# Patient Record
Sex: Female | Born: 1985 | Race: White | Hispanic: No | Marital: Single | Smoking: Current every day smoker
Health system: Southern US, Community
[De-identification: ages and names within clinical notes are randomized; demographics above are authoritative.]

## PROBLEM LIST (undated history)

## (undated) ENCOUNTER — Inpatient Hospital Stay (HOSPITAL_COMMUNITY): Admission: AD | Payer: Medicaid Other

## (undated) DIAGNOSIS — F32A Depression, unspecified: Secondary | ICD-10-CM

## (undated) DIAGNOSIS — F329 Major depressive disorder, single episode, unspecified: Secondary | ICD-10-CM

## (undated) DIAGNOSIS — F909 Attention-deficit hyperactivity disorder, unspecified type: Secondary | ICD-10-CM

## (undated) DIAGNOSIS — R87629 Unspecified abnormal cytological findings in specimens from vagina: Secondary | ICD-10-CM

## (undated) DIAGNOSIS — F419 Anxiety disorder, unspecified: Secondary | ICD-10-CM

## (undated) DIAGNOSIS — F429 Obsessive-compulsive disorder, unspecified: Secondary | ICD-10-CM

## (undated) HISTORY — PX: APPENDECTOMY: SHX54

## (undated) HISTORY — PX: DIAGNOSTIC LAPAROSCOPY: SUR761

## (undated) HISTORY — DX: Unspecified abnormal cytological findings in specimens from vagina: R87.629

---
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
## 2007-09-13 ENCOUNTER — Emergency Department (HOSPITAL_COMMUNITY): Admission: EM | Admit: 2007-09-13 | Discharge: 2007-09-14 | Payer: Self-pay | Admitting: Emergency Medicine

## 2007-12-24 ENCOUNTER — Other Ambulatory Visit: Admission: RE | Admit: 2007-12-24 | Discharge: 2007-12-24 | Payer: Self-pay | Admitting: Obstetrics and Gynecology

## 2008-01-08 ENCOUNTER — Emergency Department (HOSPITAL_COMMUNITY): Admission: EM | Admit: 2008-01-08 | Discharge: 2008-01-08 | Payer: Self-pay | Admitting: Emergency Medicine

## 2008-03-15 ENCOUNTER — Emergency Department (HOSPITAL_COMMUNITY): Admission: EM | Admit: 2008-03-15 | Discharge: 2008-03-15 | Payer: Self-pay | Admitting: Emergency Medicine

## 2008-04-24 ENCOUNTER — Ambulatory Visit (HOSPITAL_COMMUNITY): Admission: RE | Admit: 2008-04-24 | Discharge: 2008-04-24 | Payer: Self-pay | Admitting: Obstetrics and Gynecology

## 2009-03-25 ENCOUNTER — Inpatient Hospital Stay (HOSPITAL_COMMUNITY): Admission: RE | Admit: 2009-03-25 | Discharge: 2009-03-27 | Payer: Self-pay | Admitting: Obstetrics and Gynecology

## 2009-06-13 ENCOUNTER — Emergency Department (HOSPITAL_COMMUNITY): Admission: EM | Admit: 2009-06-13 | Discharge: 2009-06-13 | Payer: Self-pay | Admitting: Emergency Medicine

## 2009-07-04 ENCOUNTER — Emergency Department (HOSPITAL_COMMUNITY): Admission: EM | Admit: 2009-07-04 | Discharge: 2009-07-04 | Payer: Self-pay | Admitting: Emergency Medicine

## 2010-02-01 ENCOUNTER — Emergency Department (HOSPITAL_COMMUNITY): Admission: EM | Admit: 2010-02-01 | Discharge: 2010-02-01 | Payer: Self-pay | Admitting: Emergency Medicine

## 2010-09-09 ENCOUNTER — Encounter (INDEPENDENT_AMBULATORY_CARE_PROVIDER_SITE_OTHER): Payer: Self-pay | Admitting: *Deleted

## 2010-09-13 ENCOUNTER — Encounter (INDEPENDENT_AMBULATORY_CARE_PROVIDER_SITE_OTHER): Payer: Self-pay | Admitting: *Deleted

## 2010-09-13 ENCOUNTER — Encounter: Payer: Self-pay | Admitting: Urgent Care

## 2010-09-13 ENCOUNTER — Encounter: Payer: Self-pay | Admitting: Internal Medicine

## 2010-09-13 ENCOUNTER — Ambulatory Visit (INDEPENDENT_AMBULATORY_CARE_PROVIDER_SITE_OTHER): Payer: Medicaid Other | Admitting: Urgent Care

## 2010-09-13 DIAGNOSIS — R1031 Right lower quadrant pain: Secondary | ICD-10-CM | POA: Insufficient documentation

## 2010-09-13 DIAGNOSIS — R197 Diarrhea, unspecified: Secondary | ICD-10-CM

## 2010-09-14 NOTE — Letter (Signed)
Summary: Scheduled Appointment  [facility]  [address]   [place], [place] [address]   Phone: [phone]  Fax: [phone]    September 09, 2010   Dear: Christina Lowe            DOB: 1985/10/24    I have been instructed to schedule you an appointment in our office.  Your appointment is as follows:   Date:               September 13, 2010   Time:               10:30am     Please be here 15 minutes early.   Provider:          Jaye Beagle    Please contact the office if you need to reschedule this appointment for a more convenient time.   Thank you,    Diana Eves       [facility] Ph: [phone]   Fax: [phone]

## 2010-09-23 NOTE — Letter (Signed)
Summary: TCS ORDER  TCS ORDER   Imported By: Ave Filter 09/13/2010 11:11:42  _____________________________________________________________________  External Attachment:    Type:   Image     Comment:   External Document

## 2010-09-23 NOTE — Letter (Signed)
Summary: Out of Work Note  [facility]  [address]   [place], [place] [address]   Phone: [phone]  Fax: [phone]    09/13/2010  TO: Leodis Sias IT MAY CONCERN  RE: Christina Lowe [address] [id number] 15-Feb-1986       The above named individual has appointments on 10/05/10 at 10:00 a.m. at [facility] and 10/07/10 at 10:00 a.m. at [facility]..       If you have any further questions or need additional information, please call.     Sincerely,     [facility] R. Roetta Sessions, M.D.    Jonette Eva, M.D. Lorenza Burton, FNP-BC    Tana Coast, PA-C Phone: [phone]    Fax: [phone]

## 2010-09-23 NOTE — Assessment & Plan Note (Signed)
Summary: chronic diarrhea with bleeding   Vital Signs:  Patient profile:   25 year old female Height:      69 inches Weight:      205.50 pounds BMI:     30.46 Temp:     97.9 degrees F oral Pulse rate:   84 / minute BP sitting:   128 / 70  (right arm)  Vitals Entered By: Carolan Clines LPN (September 13, 2010 10:14 AM)  Visit Type:  Initial Consult Referring Provider:  Ninfa Linden Primary Care Provider:  Ninfa Linden, NP  Chief Complaint:  bloody diarrhea.  History of Present Illness: 25 y/o caucasian female w/ RLQ pain x 6 yrs.  Jan 2006 after delivery of child was having RLQ pain.  Had diagnostic lap Dr Emelda Fear 2009-pt states normal.  RLQ pressure intermittant.  Worse w/ eating/drinking.  BM 15 times per day w/ lots of burgundy blood w/clots.  Denies fever, chills, nausea or vomiting.  [facility]  Last unprotected sex-yesterday.  LMP:  1 year ago, was on Depo, but last dose 03/2010.  Denies heartburn, indigestion.    She is steadily gaining weight.   she denies any foreign travel, new medications, antibiotics.  Current Medications (verified): 1)  Klonopin 0.5 Mg Tabs (Clonazepam) .... Take One Once Daily 2)  Neurontin 100 Mg Caps (Gabapentin) .... Take One Once Daily 3)  Zoloft 50 Mg Tabs (Sertraline Hcl) .... Take One Half Tab Once Daily Until Further Instruction Given By Pcm  Allergies (verified): No Known Drug Allergies  Past History:  Past Medical History: cervical colposcopy & bx- ? early ca--Dr Emelda Fear asthma as infant OCD anxiety bipolar disorder  Past Surgical History: laparoscopy x2 csect x2  Family History: No known family history of colorectal carcinoma, IBD, liver or chronic GI problems. 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 9 pkyr Alcohol  Use - yes, rare Illicit Drug Use - no Smoking Status:  current Drug Use:  no  Review of Systems General:  Complains of fatigue, weakness, and malaise; denies fever, chills, sweats, anorexia, weight loss, and sleep disorder. CV:  Denies chest pains, angina, palpitations, syncope, dyspnea on exertion, orthopnea, PND, peripheral edema, and claudication. Resp:  Denies dyspnea at rest, dyspnea with exercise, cough, sputum, wheezing, coughing up blood, and pleurisy. GI:  Denies difficulty swallowing, pain on swallowing, jaundice, and fecal incontinence. GU:  Complains of amenorrhea; denies urinary burning, blood in urine, nocturnal urination, urinary frequency, urinary incontinence, and abnormal vaginal bleeding. MS:  Denies joint pain / LOM, joint swelling, joint stiffness, joint deformity, low back pain, muscle weakness, muscle cramps, muscle atrophy, leg pain at night, leg pain with exertion, and shoulder pain / LOM hand / wrist pain (CTS). Derm:  Denies rash, itching, dry skin, hives, moles, warts, and unhealing ulcers. Psych:  Denies depression, anxiety, memory loss, suicidal ideation, hallucinations, paranoia, phobia, and confusion. Heme:  Denies bruising and enlarged lymph nodes.  Physical Exam  General:  Well developed, well nourished, no acute distress. Head:  Normocephalic and atraumatic. Eyes:  Sclera clear, no icterus. Ears:  Normal auditory acuity. Nose:  No deformity, discharge,  or lesions. Mouth:  No deformity or lesions, dentition normal. Neck:  Supple; no masses or thyromegaly. Lungs:  Clear throughout to auscultation. Heart:  Regular rate and rhythm; no murmurs, rubs,  or  bruits. Abdomen:  Soft, nontender and nondistended. No masses, hepatosplenomegaly or hernias noted. Normal bowel sounds.   no rebound tenderness or guarding. Rectal:  deferred until time of colonoscopy.   Msk:  Symmetrical with no gross deformities. Normal posture. Pulses:  Normal pulses  noted. Extremities:  No clubbing, cyanosis, edema or deformities noted. Neurologic:  Alert and  oriented x4;  grossly normal neurologically. Skin:  Intact without significant lesions or rashes. Cervical Nodes:  No significant cervical adenopathy. Psych:  Alert and cooperative. Normal mood and affect.   Impression & Recommendations:  Problem # 1:  DIARRHEA, BLOODY (ICD-787.91)  Ms. Arciniega is a 25 year old Caucasian female past history of right lower quadrant pain for 6 years. she also gives history of chronic bloody diarrhea. she has had at least 2 laparoscopies and tells me they have been benign. She is going to need further evaluation to rule out inflammatory bowel disease and less likely colorectal carcinoma.  Diagnostic colonoscopy to be performed by Dr. Suszanne Conners Rourk in the near future  in the OR given history of polypharmacy.  I have discussed risks and benefits which include, but are not limited to, bleeding, infection, perforation, or medication reaction.  The patient agrees with this plan and consent will be obtained.  Orders: Consultation Level III [id number])  Problem # 2:  ABDOMINAL PAIN, RIGHT LOWER QUADRANT (ICD-789.03)  See #1  Other Orders: T-Pregnancy Test, Urine, Qual (09811)  Patient Instructions: 1)   pregnancy test prior to colonoscopy  2)   to the ER with severe pain , bleeding , or any worsening signs or symptoms   Orders Added: 1)  T-Pregnancy Test, Urine, Qual [91478] 2)  Consultation Level III [29562]  Appended Document: chronic diarrhea with bleeding  labs reviewed from Ninfa Linden, NP collected on 09/10/2010  White blood cell count 8 Hemoglobin 13.1 Hematocrit 38.5 Platelets 351  CMP normal except glucose 105   TSH normal

## 2010-10-01 LAB — CBC
HCT: 26 % — ABNORMAL LOW (ref 36.0–46.0)
MCHC: 33.9 g/dL (ref 30.0–36.0)
MCHC: 34.4 g/dL (ref 30.0–36.0)
MCV: 93 fL (ref 78.0–100.0)
MCV: 93.5 fL (ref 78.0–100.0)
Platelets: 416 10*3/uL — ABNORMAL HIGH (ref 150–400)
RBC: 3.5 MIL/uL — ABNORMAL LOW (ref 3.87–5.11)
RDW: 13.7 % (ref 11.5–15.5)
WBC: 13.1 10*3/uL — ABNORMAL HIGH (ref 4.0–10.5)

## 2010-10-01 LAB — RPR: RPR Ser Ql: NONREACTIVE

## 2010-10-04 ENCOUNTER — Other Ambulatory Visit (HOSPITAL_COMMUNITY): Payer: Medicaid Other

## 2010-10-05 ENCOUNTER — Encounter (HOSPITAL_COMMUNITY): Payer: Medicaid Other

## 2010-10-05 ENCOUNTER — Other Ambulatory Visit: Payer: Self-pay | Admitting: Internal Medicine

## 2010-10-05 LAB — BASIC METABOLIC PANEL
CO2: 22 mEq/L (ref 19–32)
GFR calc Af Amer: >60 mL/min (ref >60)
GFR calc non Af Amer: >60 mL/min (ref >60)
Glucose, Bld: 94 mg/dL (ref 70–99)
Potassium: 4.3 mEq/L (ref 3.5–5.1)
Sodium: 137 mEq/L (ref 135–145)

## 2010-10-05 LAB — HEMOGLOBIN AND HEMATOCRIT, BLOOD: HCT: 38.4 % (ref 36.0–46.0)

## 2010-10-07 ENCOUNTER — Other Ambulatory Visit: Payer: Self-pay | Admitting: Internal Medicine

## 2010-10-07 ENCOUNTER — Ambulatory Visit (HOSPITAL_COMMUNITY)
Admission: RE | Admit: 2010-10-07 | Discharge: 2010-10-07 | Disposition: A | Discharge status: Home / Self Care | Payer: Medicaid Other | Source: Ambulatory Visit | Attending: Internal Medicine | Admitting: Internal Medicine

## 2010-10-07 ENCOUNTER — Encounter: Payer: Medicaid Other | Admitting: Internal Medicine

## 2010-10-07 DIAGNOSIS — R197 Diarrhea, unspecified: Secondary | ICD-10-CM

## 2010-10-07 DIAGNOSIS — Z79899 Other long term (current) drug therapy: Secondary | ICD-10-CM | POA: Insufficient documentation

## 2010-10-07 DIAGNOSIS — K921 Melena: Secondary | ICD-10-CM | POA: Insufficient documentation

## 2010-10-07 DIAGNOSIS — D129 Benign neoplasm of anus and anal canal: Secondary | ICD-10-CM | POA: Insufficient documentation

## 2010-10-07 DIAGNOSIS — R1031 Right lower quadrant pain: Secondary | ICD-10-CM | POA: Insufficient documentation

## 2010-10-07 DIAGNOSIS — D128 Benign neoplasm of rectum: Secondary | ICD-10-CM | POA: Insufficient documentation

## 2010-10-07 HISTORY — PX: COLONOSCOPY: SHX174

## 2010-10-11 NOTE — Op Note (Signed)
  NAMECORTNEY, Christina Lowe                 ACCOUNT NO.:  [id number]  MEDICAL RECORD NO.:  [id number]           PATIENT TYPE:  O  LOCATION:  DAYP                          FACILITY:  [facility]  PHYSICIAN:  R. Roetta Sessions, M.D. DATE OF BIRTH:  21-Jul-1985  DATE OF PROCEDURE:  10/07/2010 DATE OF DISCHARGE:                              OPERATIVE REPORT   INDICATIONS FOR PROCEDURE:  A 26 year old lady with right lower quadrant abdominal pain for years, intermittent bloody diarrhea reported, H and H normal at 12.7 and 38.4.  Urine pregnancy test negative.  She has been extensively evaluated as the etiology of her symptoms.  Colonoscopy is now being done.  Risks, benefits, limitations, alternatives and imponderables have been discussed because of polypharmacy, she is being done under propofol.  Please see the documentation in the medical record.  PROCEDURE NOTE:  O2 saturation, blood pressure, pulse and respirations were monitored throughout the entire procedure.  Conscious sedation with propofol per [facility].  INSTRUMENT:  Pentax video chip system.  FINDINGS:  Digital rectal exam revealed no abnormalities.  Endoscopic findings:  Prep was good.  Colon:  Colonic mucosa was surveyed from the rectosigmoid junction through the left transverse right colon to the appendiceal orifice, ileocecal valve/cecum.  These structures were well seen and photographed for record.  Terminal ileum was intubated to 15- cm.  From this level, scope was slowly and cautiously withdrawn.  All previously mentioned mucosal surfaces were again seen.  The colonic mucosa appeared entirely normal as did the terminal ileal mucosa.  Scope was pulled down into the rectum where thorough examination of the rectal mucosa including retroflexed view of the anal verge demonstrated a 7-mm pedunculated hyperplastic-appearing polyp in at 3-cm of the anal verge with an adjacent diminutive polyp.  This large polyp was removed  with snare cautery.  The small polyp was ablated.  The remainder of the rectal mucosa appeared entirely normal.  The patient tolerated the procedure well.  Cecal withdrawal time 9 minutes.  IMPRESSION: 1. Small distal rectal polyp, status post snare polypectomy and     ablation, otherwise normal-appearing anal canal, rectum, colon and     terminal ileum. 2. I doubt significant GI bleeding. 3. Chronic right lower quadrant abdominal pain may be due to occult     GYN pathology adhesive disease or chronic appendicitis.  RECOMMENDATIONS: 1. We will follow up on path. 2. May well recommend that she have a surgical consultation for a     diagnostic laparoscopy and incidental appendectomy in the near     future.     Jonathon Bellows, M.D.     RMR/MEDQ  D:  10/07/2010  T:  10/07/2010  Job:  [id number]  Electronically Signed by Lorrin Goodell M.D. on 10/11/2010 12:36:17 PM

## 2010-10-14 ENCOUNTER — Telehealth: Payer: Self-pay

## 2010-10-14 NOTE — Telephone Encounter (Signed)
Pt called requesting results from her tcs. Pt is worried about the results. Please advise

## 2010-10-15 NOTE — Telephone Encounter (Signed)
RMR wrote letter with results for pt. Pt called- I gave her results from letter and told her letter was in the mail. Pt stated she was still having diarrhea and still seeing blood in her stool. Pt wants to know what she should do about it. Please advise.

## 2010-10-17 NOTE — Telephone Encounter (Signed)
We can set up an ov with extender and review everything done to date including my latest recommendations

## 2010-10-19 ENCOUNTER — Encounter: Payer: Self-pay | Admitting: Internal Medicine

## 2010-10-19 NOTE — Telephone Encounter (Signed)
SS- please schedule appt.

## 2010-10-19 NOTE — Telephone Encounter (Signed)
Pt is aware of OV on 10/25/10 @ 2 pm with KJ and I also faxed to [employer] for her to get a gas voucher.

## 2010-10-20 NOTE — H&P (Addendum)
  NAMEDMIYA, MALPHRUS                 ACCOUNT NO.:  [id number]  MEDICAL RECORD NO.:  [id number]           PATIENT TYPE:  LOCATION:  DAYP                          FACILITY:  [facility]  PHYSICIAN:  Dalia Heading, M.D.  DATE OF BIRTH:  1986-03-16  DATE OF ADMISSION: DATE OF DISCHARGE:  LH                             HISTORY & PHYSICAL   CHIEF COMPLAINT:  Chronic right lower quadrant abdominal pain.  HISTORY OF PRESENT ILLNESS:  The patient is a 25 year old white female who has had chronic intermittent right lower quadrant abdominal pain for many years.  This does seem to be associated with bowel movements on occasion.  She has had an extensive workup by Dr. Jena Gauss and Emelda Fear which have not revealed any significant explanation for the right lower quadrant abdominal pain.  It is not made worse with movement.  She denies any fever, chills, nausea, or vomiting.  She states some of this pain started as soon after a C-section in the past, though she does not have pain along the incision site.  PAST MEDICAL HISTORY:  Anxiety/bipolar disorder.  PAST SURGICAL HISTORY:  Diagnostic laparoscopy, C-section and colonoscopy.  CURRENT MEDICATIONS:  Klonopin and Zoloft.  ALLERGIES:  CODEINE.  REVIEW OF SYSTEMS:  The patient smokes half pack cigarettes a day.  She denies alcohol use.  She denies any other cardiopulmonary difficulties or bleeding disorders.  FAMILY MEDICAL HISTORY:  Noncontributory.  PHYSICAL EXAMINATION:  GENERAL:  The patient is a well-developed, well- nourished white female who is slightly anxious, but in no acute distress. HEENT:  Unremarkable. LUNGS:  Clear to auscultation with equal breath sounds bilaterally. HEART:  Regular rate and rhythm without S3, S4 or murmurs. ABDOMEN:  Soft with some tenderness noted on the right lower quadrant to palpation.  No hepatosplenomegaly, masses or hernias identified.  IMPRESSION:  Right lower quadrant abdominal pain of unknown  etiology.  PLAN:  The patient is scheduled for a diagnostic laparoscopy/appendectomy on Oct 29, 2010.  The risks and benefits of the procedure including bleeding, infection, recurrence of the pain, the possibility of an open procedure were fully explained to the patient, gave informed consent.     Dalia Heading, M.D.     MAJ/MEDQ  D:  10/19/2010  T:  10/20/2010  Job:  [id number]  cc:   R. Roetta Sessions, M.D. [address] [place] [address]  Electronically Signed by Franky Macho M.D. on 10/30/2010 08:07:40 PM

## 2010-10-24 ENCOUNTER — Encounter: Payer: Self-pay | Admitting: Internal Medicine

## 2010-10-24 NOTE — Progress Notes (Signed)
Letter should be under my name and not nurses

## 2010-10-25 ENCOUNTER — Ambulatory Visit: Payer: Medicaid Other | Admitting: Urgent Care

## 2010-10-26 ENCOUNTER — Encounter (HOSPITAL_COMMUNITY): Payer: Medicaid Other

## 2010-10-26 ENCOUNTER — Other Ambulatory Visit: Payer: Self-pay | Admitting: General Surgery

## 2010-10-26 LAB — BASIC METABOLIC PANEL
Calcium: 9.6 mg/dL (ref 8.4–10.5)
GFR calc Af Amer: >60 mL/min (ref >60)
GFR calc non Af Amer: >60 mL/min (ref >60)
Glucose, Bld: 80 mg/dL (ref 70–99)
Potassium: 4.1 mEq/L (ref 3.5–5.1)
Sodium: 138 mEq/L (ref 135–145)

## 2010-10-26 LAB — CBC
HCT: 37.3 % (ref 36.0–46.0)
Hemoglobin: 12.6 g/dL (ref 12.0–15.0)
MCHC: 33.8 g/dL (ref 30.0–36.0)
RBC: 4.12 MIL/uL (ref 3.87–5.11)

## 2010-10-26 LAB — SURGICAL PCR SCREEN: MRSA, PCR: NEGATIVE

## 2010-10-29 ENCOUNTER — Ambulatory Visit (HOSPITAL_COMMUNITY)
Admission: RE | Admit: 2010-10-29 | Discharge: 2010-10-29 | Disposition: A | Discharge status: Home / Self Care | Payer: Medicaid Other | Source: Ambulatory Visit | Attending: General Surgery | Admitting: General Surgery

## 2010-10-29 ENCOUNTER — Other Ambulatory Visit: Payer: Self-pay | Admitting: General Surgery

## 2010-10-29 DIAGNOSIS — R1031 Right lower quadrant pain: Secondary | ICD-10-CM | POA: Insufficient documentation

## 2010-10-29 DIAGNOSIS — K3580 Unspecified acute appendicitis: Secondary | ICD-10-CM | POA: Insufficient documentation

## 2010-10-30 NOTE — Op Note (Signed)
Christina Lowe, Christina Lowe                 ACCOUNT NO.:  [id number]  MEDICAL RECORD NO.:  [id number]           PATIENT TYPE:  O  LOCATION:  DAYP                          FACILITY:  [facility]  PHYSICIAN:  Dalia Heading, M.D.  DATE OF BIRTH:  1985/12/03  DATE OF PROCEDURE:  10/29/2010 DATE OF DISCHARGE:                              OPERATIVE REPORT   PREOPERATIVE DIAGNOSIS:  Chronic lower abdominal pain.  POSTOPERATIVE DIAGNOSIS:  Chronic lower abdominal pain.  PROCEDURE:  Diagnostic laparoscopy, laparoscopic appendectomy.  SURGEON:  Dalia Heading, MD  ANESTHESIA:  General endotracheal.  INDICATIONS:  The patient is a 25 year old white female who has had chronic intermittent right lower quadrant abdominal pain for many years. She has had extensive workup by doctors Rourk and [employer].  She now presents to the operating room for diagnostic laparoscopy with incidental appendectomy.  The risks and benefits of the procedure including bleeding, infection, and the possibility of recurrence of the pain were fully explained to the patient, gave informed consent.  PROCEDURE NOTE:  The patient was placed in supine position.  After induction of general endotracheal anesthesia, the abdomen was prepped and draped in the usual sterile technique with DuraPrep.  Surgical site confirmation was performed.  A supraumbilical incision was made down to the fascia.  A Veress needle was introduced into the abdominal cavity and confirmation of placement was done using the saline drop test.  The abdomen was then insufflated to 15 mmHg pressure.  A 11-mm trocar was introduced into the abdominal cavity under direct visualization without difficulty.  The patient was placed in deeper Trendelenburg position.  Additional 12-mm trocar was placed in suprapubic region and a 5-mm trocar was placed in the left lower quadrant region.  The right tube and ovary were noted to be within normal limits.  The terminal ileum was  inspected and there was no evidence of inflammatory bowel disease or Meckel's diverticulum.  The appendix was noted to be significantly elongated and little sclerotic at its base.  It was elected to proceed with an appendectomy.  The mesoappendix was divided using the harmonic scalpel.  A vascular Endo- GIA was placed across the base of the appendix and fired.  The appendix was then removed using EndoCatch bag.  The staple line was inspected and noted to be within normal limits.  All fluid and air was then evacuated from the abdominal cavity prior to removal of the trocars.  All wounds were irrigated with normal saline.  All wounds were injected with 0.5% Sensorcaine.  The supraumbilical fascia was reapproximated using an 0 Vicryl interrupted suture.  All skin incisions were closed using staples.  Betadine ointment and dry sterile dressings were applied.  All tape and needle counts were correct at the end of the procedure. The patient was extubated in the operating room and went back to recovery room awake in stable condition.  COMPLICATIONS:  None. SPECIMEN:  Appendix.  BLOOD LOSS:  Minimal.     Dalia Heading, M.D.     MAJ/MEDQ  D:  10/29/2010  T:  10/29/2010  Job:  [id number]  cc:  Jonathon Bellows, M.D. [address] [place] [address]  Tilda Burrow, M.D. Fax: [phone]  Electronically Signed by Franky Macho M.D. on 10/30/2010 08:07:42 PM
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
## 2010-11-09 NOTE — Op Note (Signed)
NAMEJAYLNN, ULLERY                 ACCOUNT NO.:  [id number]   MEDICAL RECORD NO.:  [id number]          PATIENT TYPE:  AMB   LOCATION:  DAY                           FACILITY:  [facility]   PHYSICIAN:  Tilda Burrow, M.D. DATE OF BIRTH:  18-Aug-1985   DATE OF PROCEDURE:  DATE OF DISCHARGE:                               OPERATIVE REPORT   PREOPERATIVE DIAGNOSES:  1. Chronic right lower quadrant pain.  2. History of right ovarian cystectomy.  3. Possible pelvic adhesions.   POSTOPERATIVE DIAGNOSES:  1. Chronic right lower quadrant pain.  2. Abdominal wall trigger point.  3. Normal pelvic structures without adhesions.   PROCEDURE:  Diagnostic laparoscopy, exploration of trigger point in the  right lower quadrant.   INDICATIONS:  A 25 year old female gravida 1, para 1 with chronic right  lower quadrant pain at the level of the fascia and with a complicated  medical and social history raising questions of pelvic adhesions.  See  HPI.   DETAILS OF PROCEDURE:  The patient was taken to the operating room,  prepped and draped for combined abdominal and vaginal procedure with  single-tooth tenaculum attached to the cervix, long enough to place the  Hulka uterine manipulator in place, and placed Foley catheter.  Abdomen  was addressed by palpating the umbilicus and noticing that sacral  promontory was extremely prominent and at least 4 cm below the  umbilicus.  Effort was made to take the abdominal wall and pull it  inferior to allow introduction of Veress needle.  Pneumoperitoneum was  initiated, but we were in the preperitoneal space as indicated by  pressures.  An open laparoscopy technique was then selected.  The  umbilical midline vertical incision was made at length of 3 cm and then  subcu fatty tissue was opened down to the fascia, which was grasped,  elevated, transected transversely, and then the peritoneal cavity able  to be entered carefully with no evidence of injury to internal  organs.  Laparoscopic 11-mm trocar could then be slipped through the fascia with  good fit and the pneumoperitoneum achieved.  Inspection of the abdomen  and pelvis laparoscopically showed no evidence of trauma to the internal  organs.  The pelvis was inspected and was free of adhesions other than  some scarring and retraction on the anterior abdominal wall at the site  of the old C-section scar, which pulled the urachus and then tied  against the anterior abdominal wall.  Suprapubic trocar was placed as  well as a left lower quadrant trocar.  The adhesions around the urachus  were released to allow for more relaxed pelvic structures.  Photo  documentation was performed to confirm the normalcy of the findings.  A  patient copy will be given.   The abdomen was kept inflated with laparoscopic instruments removed.  Trocar sleeves left in place and the right lower quadrant trigger point  addressed.  The old Pfannenstiel incision was opened at the right corner  and extending approximately 3 cm to pass the end of the incision in a  semilunar cut.  The fatty  tissues were opened down to the fascia, which  could be freed up for some adhesions from the fatty tissue to the  abdominal wall.  The fascia itself in this area was extremely smooth and  well healed, there was absolutely no evidence of herniation in the area.  There was no significant retraction, it was felt that dissection through  the fascia would be counter-productive.  The trigger point was  considered successfully explored.  The fatty tissue and its adhesions  were completely freed up from the underlying fascia.  The fatty tissues  were loosely reapproximated with 3-0 Vicryl.  The deflation of the  abdomen and removal of the laparoscopic instruments was then performed.  The fascia was closed.  The umbilicus was closed using interrupted 0-  Vicryl x3 sutures.  Then, the subcu tissue was closed using 3-0 Vicryl,  and 4-0 Vicryl was used  to close the skin incision with a subcuticular  closure.  Steri-Strips and OpSite were placed, and the patient went to  recovery room in good condition.  Marcaine was injected around each  incision site, total of 10 mL.  Sponge and needle counts were correct.  The patient tolerated the procedure well and went to recovery room in  good condition.      Tilda Burrow, M.D.  Electronically Signed     JVF/MEDQ  D:  04/24/2008  T:  04/25/2008  Job:  [id number]
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
## 2010-11-26 ENCOUNTER — Emergency Department (HOSPITAL_COMMUNITY)
Admission: EM | Admit: 2010-11-26 | Discharge: 2010-11-27 | Disposition: A | Discharge status: Home / Self Care | Payer: Medicaid Other | Attending: Emergency Medicine | Admitting: Emergency Medicine

## 2010-11-26 DIAGNOSIS — B009 Herpesviral infection, unspecified: Secondary | ICD-10-CM | POA: Insufficient documentation

## 2010-11-26 DIAGNOSIS — F17200 Nicotine dependence, unspecified, uncomplicated: Secondary | ICD-10-CM | POA: Insufficient documentation

## 2010-11-29 LAB — HERPES SIMPLEX VIRUS CULTURE: Culture: DETECTED

## 2011-03-21 LAB — URINALYSIS, ROUTINE W REFLEX MICROSCOPIC
Bilirubin Urine: NEGATIVE
Glucose, UA: NEGATIVE
Hgb urine dipstick: NEGATIVE
Ketones, ur: NEGATIVE
Urobilinogen, UA: 0.2

## 2011-03-24 LAB — URINALYSIS, ROUTINE W REFLEX MICROSCOPIC
Glucose, UA: NEGATIVE
Hgb urine dipstick: NEGATIVE
Protein, ur: NEGATIVE
Specific Gravity, Urine: 1.02
pH: 8

## 2011-03-24 LAB — URINE MICROSCOPIC-ADD ON

## 2011-03-24 LAB — CBC
HCT: 37.4
MCV: 89.9
Platelets: 246
RBC: 4.15
RDW: 13.8
WBC: 11.4 — ABNORMAL HIGH
WBC: 11.5 — ABNORMAL HIGH

## 2011-03-24 LAB — CULTURE, BLOOD (ROUTINE X 2): Report Status: 7192009

## 2011-03-24 LAB — DIFFERENTIAL
Basophils Relative: 0
Lymphs Abs: 1.3
Monocytes Relative: 5
Neutro Abs: 9.6 — ABNORMAL HIGH
Neutrophils Relative %: 83 — ABNORMAL HIGH

## 2011-03-24 LAB — WET PREP, GENITAL

## 2011-03-24 LAB — COMPREHENSIVE METABOLIC PANEL
AST: 15
Albumin: 4
BUN: 5 — ABNORMAL LOW
Chloride: 103
Creatinine, Ser: 0.71
GFR calc Af Amer: >60
Potassium: 3.8
Total Protein: 6.8

## 2011-03-24 LAB — GC/CHLAMYDIA PROBE AMP, GENITAL
Chlamydia, DNA Probe: NEGATIVE
GC Probe Amp, Genital: NEGATIVE

## 2011-03-28 LAB — CBC
Hemoglobin: 12.4
MCHC: 33.4
MCV: 90.6
RBC: 4.08
WBC: 7

## 2011-09-17 ENCOUNTER — Emergency Department (HOSPITAL_COMMUNITY): Payer: Medicaid Other

## 2011-09-17 ENCOUNTER — Encounter (HOSPITAL_COMMUNITY): Payer: Self-pay

## 2011-09-17 ENCOUNTER — Emergency Department (HOSPITAL_COMMUNITY)
Admission: EM | Admit: 2011-09-17 | Discharge: 2011-09-17 | Disposition: A | Discharge status: Home / Self Care | Payer: Medicaid Other | Attending: Emergency Medicine | Admitting: Emergency Medicine

## 2011-09-17 DIAGNOSIS — J45909 Unspecified asthma, uncomplicated: Secondary | ICD-10-CM | POA: Insufficient documentation

## 2011-09-17 DIAGNOSIS — M25562 Pain in left knee: Secondary | ICD-10-CM

## 2011-09-17 DIAGNOSIS — W108XXA Fall (on) (from) other stairs and steps, initial encounter: Secondary | ICD-10-CM | POA: Insufficient documentation

## 2011-09-17 DIAGNOSIS — F17200 Nicotine dependence, unspecified, uncomplicated: Secondary | ICD-10-CM | POA: Insufficient documentation

## 2011-09-17 DIAGNOSIS — M25569 Pain in unspecified knee: Secondary | ICD-10-CM | POA: Insufficient documentation

## 2011-09-17 DIAGNOSIS — M7989 Other specified soft tissue disorders: Secondary | ICD-10-CM | POA: Insufficient documentation

## 2011-09-17 MED ORDER — ONDANSETRON 8 MG PO TBDP
8.0000 mg | ORAL_TABLET | Freq: Once | ORAL | Status: AC
  Administered 2011-09-17: 8 mg via ORAL
  Filled 2011-09-17: qty 1

## 2011-09-17 MED ORDER — HYDROCODONE-ACETAMINOPHEN 5-500 MG PO TABS: 1.0000 | ORAL_TABLET | ORAL | Status: AC | PRN

## 2011-09-17 MED ORDER — HYDROCODONE-ACETAMINOPHEN 5-325 MG PO TABS
1.0000 | ORAL_TABLET | Freq: Once | ORAL | Status: AC
  Administered 2011-09-17: 1 via ORAL
  Filled 2011-09-17: qty 1

## 2011-09-17 MED ORDER — IBUPROFEN 800 MG PO TABS: 800.0000 mg | ORAL_TABLET | Freq: Three times a day (TID) | ORAL | Status: AC

## 2011-09-17 NOTE — ED Notes (Signed)
Pt states hurt left knee after falling down stairs yesterday. Pt has noted swelling and bruising to left knee. Ice pack applied and extremity elevated. Xray pending.

## 2011-09-17 NOTE — Discharge Instructions (Signed)
Knee Pain The knee is the complex joint between your thigh and your lower leg. It is made up of bones, tendons, ligaments, and cartilage. The bones that make up the knee are:  The femur in the thigh.   The tibia and fibula in the lower leg.   The patella or kneecap riding in the groove on the lower femur.  CAUSES  Knee pain is a common complaint with many causes. A few of these causes are:  Injury, such as:   A ruptured ligament or tendon injury.   Torn cartilage.   Medical conditions, such as:   Gout   Arthritis   Infections   Overuse, over training or overdoing a physical activity.  Knee pain can be minor or severe. Knee pain can accompany debilitating injury. Minor knee problems often respond well to self-care measures or get well on their own. More serious injuries may need medical intervention or even surgery. SYMPTOMS The knee is complex. Symptoms of knee problems can vary widely. Some of the problems are:  Pain with movement and weight bearing.   Swelling and tenderness.   Buckling of the knee.   Inability to straighten or extend your knee.   Your knee locks and you cannot straighten it.   Warmth and redness with pain and fever.   Deformity or dislocation of the kneecap.  DIAGNOSIS  Determining what is wrong may be very straight forward such as when there is an injury. It can also be challenging because of the complexity of the knee. Tests to make a diagnosis may include:  Your caregiver taking a history and doing a physical exam.   Routine X-rays can be used to rule out other problems. X-rays will not reveal a cartilage tear. Some injuries of the knee can be diagnosed by:   Arthroscopy a surgical technique by which a small video camera is inserted through tiny incisions on the sides of the knee. This procedure is used to examine and repair internal knee joint problems. Tiny instruments can be used during arthroscopy to repair the torn knee cartilage  (meniscus).   Arthrography is a radiology technique. A contrast liquid is directly injected into the knee joint. Internal structures of the knee joint then become visible on X-ray film.   An MRI scan is a non x-ray radiology procedure in which magnetic fields and a computer produce two- or three-dimensional images of the inside of the knee. Cartilage tears are often visible using an MRI scanner. MRI scans have largely replaced arthrography in diagnosing cartilage tears of the knee.   Blood work.   Examination of the fluid that helps to lubricate the knee joint (synovial fluid). This is done by taking a sample out using a needle and a syringe.  TREATMENT The treatment of knee problems depends on the cause. Some of these treatments are:  Depending on the injury, proper casting, splinting, surgery or physical therapy care will be needed.   Give yourself adequate recovery time. Do not overuse your joints. If you begin to get sore during workout routines, back off. Slow down or do fewer repetitions.   For repetitive activities such as cycling or running, maintain your strength and nutrition.   Alternate muscle groups. For example if you are a weight lifter, work the upper body on one day and the lower body the next.   Either tight or weak muscles do not give the proper support for your knee. Tight or weak muscles do not absorb the stress placed   on the knee joint. Keep the muscles surrounding the knee strong.   Take care of mechanical problems.   If you have flat feet, orthotics or special shoes may help. See your caregiver if you need help.   Arch supports, sometimes with wedges on the inner or outer aspect of the heel, can help. These can shift pressure away from the side of the knee most bothered by osteoarthritis.   A brace called an "unloader" brace also may be used to help ease the pressure on the most arthritic side of the knee.   If your caregiver has prescribed crutches, braces,  wraps or ice, use as directed. The acronym for this is PRICE. This means protection, rest, ice, compression and elevation.   Nonsteroidal anti-inflammatory drugs (NSAID's), can help relieve pain. But if taken immediately after an injury, they may actually increase swelling. Take NSAID's with food in your stomach. Stop them if you develop stomach problems. Do not take these if you have a history of ulcers, stomach pain or bleeding from the bowel. Do not take without your caregiver's approval if you have problems with fluid retention, heart failure, or kidney problems.   For ongoing knee problems, physical therapy may be helpful.   Glucosamine and chondroitin are over-the-counter dietary supplements. Both may help relieve the pain of osteoarthritis in the knee. These medicines are different from the usual anti-inflammatory drugs. Glucosamine may decrease the rate of cartilage destruction.   Injections of a corticosteroid drug into your knee joint may help reduce the symptoms of an arthritis flare-up. They may provide pain relief that lasts a few months. You may have to wait a few months between injections. The injections do have a small increased risk of infection, water retention and elevated blood sugar levels.   Hyaluronic acid injected into damaged joints may ease pain and provide lubrication. These injections may work by reducing inflammation. A series of shots may give relief for as long as 6 months.   Topical painkillers. Applying certain ointments to your skin may help relieve the pain and stiffness of osteoarthritis. Ask your pharmacist for suggestions. Many over the-counter products are approved for temporary relief of arthritis pain.   In some countries, doctors often prescribe topical NSAID's for relief of chronic conditions such as arthritis and tendinitis. A review of treatment with NSAID creams found that they worked as well as oral medications but without the serious side effects.    PREVENTION  Maintain a healthy weight. Extra pounds put more strain on your joints.   Get strong, stay limber. Weak muscles are a common cause of knee injuries. Stretching is important. Include flexibility exercises in your workouts.   Be smart about exercise. If you have osteoarthritis, chronic knee pain or recurring injuries, you may need to change the way you exercise. This does not mean you have to stop being active. If your knees ache after jogging or playing basketball, consider switching to swimming, water aerobics or other low-impact activities, at least for a few days a week. Sometimes limiting high-impact activities will provide relief.   Make sure your shoes fit well. Choose footwear that is right for your sport.   Protect your knees. Use the proper gear for knee-sensitive activities. Use kneepads when playing volleyball or laying carpet. Buckle your seat belt every time you drive. Most shattered kneecaps occur in car accidents.   Rest when you are tired.  SEEK MEDICAL CARE IF:  You have knee pain that is continual and does not   seem to be getting better.  SEEK IMMEDIATE MEDICAL CARE IF:  Your knee joint feels hot to the touch and you have a high fever. MAKE SURE YOU:   Understand these instructions.   Will watch your condition.   Will get help right away if you are not doing well or get worse.  Document Released: 04/10/2007 Document Revised: 06/02/2011 Document Reviewed: 04/10/2007 [employer] Patient Information 2012 [employer]. 

## 2011-09-17 NOTE — ED Provider Notes (Signed)
History     CSN: [id number]  Arrival date & time 09/17/11  1352   First MD Initiated Contact with Patient 09/17/11 1401      Chief Complaint  Patient presents with  . Knee Pain    (Consider location/radiation/quality/duration/timing/severity/associated sxs/prior treatment) HPI Comments: Patient complains of pain to her medial left knee. States pain began yesterday after she fell down some steps. Pain is worse with weightbearing or extension of her knee. She denies other injuries. She also denies pain to her left thigh, ankle and calf.  She states that she applied heat to her knee last evening, she also complains of swelling to the left medial knee.  Patient is a 26 y.o. female presenting with knee pain. The history is provided by the patient. No language interpreter was used.  Knee Pain This is a new problem. The current episode started yesterday. The problem occurs constantly. The problem has been unchanged. Associated symptoms include arthralgias and joint swelling. Pertinent negatives include no fever, neck pain, numbness, vomiting or weakness. The symptoms are aggravated by standing, twisting and walking (Palpation). She has tried nothing for the symptoms. The treatment provided no relief.    Past Medical History  Diagnosis Date  . Asthma     Past Surgical History  Procedure Date  . Cesarean section     No family history on file.  History  Substance Use Topics  . Smoking status: Current Everyday Smoker  . Smokeless tobacco: Not on file  . Alcohol Use: Yes     occ    OB History    Grav Para Term Preterm Abortions TAB SAB Ect Mult Living                  Review of Systems  Constitutional: Negative for fever and appetite change.  HENT: Negative for neck pain.   Gastrointestinal: Negative for vomiting.  Musculoskeletal: Positive for joint swelling and arthralgias. Negative for back pain.  Skin: Negative.  Negative for wound.  Neurological: Negative for weakness  and numbness.  All other systems reviewed and are negative.    Allergies  Codeine  Home Medications  No current outpatient prescriptions on file.  BP 135/81  Pulse 99  Temp(Src) 99.2 F (37.3 C) (Oral)  Resp 18  Ht 5\' 9"  (1.753 m)  Wt 174 lb (78.926 kg)  BMI 25.70 kg/m2  SpO2 99%  LMP 04/19/2011  Physical Exam  Nursing note and vitals reviewed. Constitutional: She is oriented to person, place, and time. She appears well-developed and well-nourished. No distress.  HENT:  Head: Normocephalic and atraumatic.  Neck: Normal range of motion. Neck supple.  Cardiovascular: Normal rate, regular rhythm and normal heart sounds.   No murmur heard. Pulmonary/Chest: Effort normal and breath sounds normal. No respiratory distress. She exhibits no tenderness.  Musculoskeletal: She exhibits tenderness. She exhibits no edema.       Left knee: She exhibits decreased range of motion and ecchymosis. She exhibits no swelling, no effusion, no deformity, no laceration and no erythema. tenderness found. Medial joint line tenderness noted.       Legs:      Diffuse tenderness to palpation of the medial left knee. No obvious effusion or soft tissue swelling. There is a small old appearing bruise just medial to the patella. Left calf is soft and nontender. DP pulse is brisk, distal sensation intact  Neurological: She is alert and oriented to person, place, and time. She exhibits normal muscle tone. Coordination normal.  Skin:  Skin is warm and dry.    ED Course  Procedures (including critical care time)  Labs Reviewed - No data to display Dg Knee Complete 4 Views Left  09/17/2011  *RADIOLOGY REPORT*  Clinical Data: Post fall, now with medial knee pain and swelling.  LEFT KNEE - COMPLETE 4+ VIEW  Comparison: 03/15/2008  Findings: No fracture or dislocation.  No joint effusion.  Joint spaces are preserved.  Regional soft tissues are normal.  IMPRESSION: Normal radiographs of the left knee.  Original  Report Authenticated By: Waynard Reeds, M.D.        MDM    Patient has tenderness to palpation of the medial left knee. No obvious effusion, deformity, patellar tenderness or erythema. There is a small area of bruising. Today's x-rays are within normal limits.  Injury could likely be related to the meniscus or ligamentous injury.   I will place her in a knee immobilizer and dispense crutches she agrees to close followup with and orthopedist.  Patient / Family / Caregiver understand and agree with initial ED impression and plan with expectations set for ED visit. Pt stable in ED with no significant deterioration in condition. Pt feels improved after observation and/or treatment in ED.          Earnestine Tuohey L. Ramirez [place], [place] 09/17/11 1502

## 2011-09-17 NOTE — ED Notes (Signed)
Pt reports fell down 3 stairs yesterday playing with her kids.  C/O pain to left knee.

## 2011-09-18 NOTE — ED Provider Notes (Signed)
Medical screening examination/treatment/procedure(s) were performed by non-physician practitioner and as supervising physician I was immediately available for consultation/collaboration.   Jerlisa Diliberto W Tykeem Lanzer, MD 09/18/11 0700 

## 2011-09-21 ENCOUNTER — Other Ambulatory Visit: Payer: Self-pay | Admitting: Obstetrics and Gynecology

## 2011-09-21 ENCOUNTER — Other Ambulatory Visit (HOSPITAL_COMMUNITY)
Admission: RE | Admit: 2011-09-21 | Discharge: 2011-09-21 | Disposition: A | Discharge status: Home / Self Care | Payer: Medicaid Other | Source: Ambulatory Visit | Attending: Obstetrics and Gynecology | Admitting: Obstetrics and Gynecology

## 2011-09-21 DIAGNOSIS — Z01419 Encounter for gynecological examination (general) (routine) without abnormal findings: Secondary | ICD-10-CM | POA: Insufficient documentation

## 2011-09-21 DIAGNOSIS — Z113 Encounter for screening for infections with a predominantly sexual mode of transmission: Secondary | ICD-10-CM | POA: Insufficient documentation

## 2011-10-11 ENCOUNTER — Other Ambulatory Visit: Payer: Self-pay | Admitting: Obstetrics and Gynecology

## 2011-11-29 LAB — OB RESULTS CONSOLE RUBELLA ANTIBODY, IGM: Rubella: IMMUNE

## 2011-11-29 LAB — OB RESULTS CONSOLE HEPATITIS B SURFACE ANTIGEN: Hepatitis B Surface Ag: NEGATIVE

## 2011-11-29 LAB — OB RESULTS CONSOLE ABO/RH: RH Type: POSITIVE

## 2011-11-29 LAB — OB RESULTS CONSOLE RPR: RPR: NONREACTIVE

## 2012-03-15 ENCOUNTER — Other Ambulatory Visit: Payer: Self-pay | Admitting: Obstetrics & Gynecology

## 2012-06-05 ENCOUNTER — Encounter (HOSPITAL_COMMUNITY): Payer: Self-pay | Admitting: Pharmacist

## 2012-06-08 ENCOUNTER — Encounter (HOSPITAL_COMMUNITY): Payer: Self-pay

## 2012-06-11 ENCOUNTER — Encounter (HOSPITAL_COMMUNITY)
Admission: RE | Admit: 2012-06-11 | Discharge: 2012-06-11 | Disposition: A | Discharge status: Home / Self Care | Payer: Medicaid Other | Source: Ambulatory Visit | Attending: Obstetrics and Gynecology | Admitting: Obstetrics and Gynecology

## 2012-06-11 ENCOUNTER — Encounter (HOSPITAL_COMMUNITY): Payer: Self-pay

## 2012-06-11 HISTORY — DX: Anxiety disorder, unspecified: F41.9

## 2012-06-11 HISTORY — DX: Depression, unspecified: F32.A

## 2012-06-11 HISTORY — DX: Major depressive disorder, single episode, unspecified: F32.9

## 2012-06-11 LAB — SURGICAL PCR SCREEN: MRSA, PCR: NEGATIVE

## 2012-06-11 LAB — CBC
Hemoglobin: 10.7 g/dL — ABNORMAL LOW (ref 12.0–15.0)
MCH: 30.6 pg (ref 26.0–34.0)
MCHC: 32.6 g/dL (ref 30.0–36.0)
MCV: 93.7 fL (ref 78.0–100.0)

## 2012-06-11 LAB — RPR: RPR Ser Ql: NONREACTIVE

## 2012-06-11 LAB — ABO/RH: ABO/RH(D): B POS

## 2012-06-11 LAB — TYPE AND SCREEN

## 2012-06-11 NOTE — Patient Instructions (Addendum)
Your procedure is scheduled on:06/16/12  Enter through the Main Entrance at :0730 am Pick up desk phone and dial [phone] and inform us of your arrival.  Please call [phone] if you have any problems the morning of surgery.  Remember: Do not eat or drink after midnight:Friday   Take these meds the morning of surgery with a sip of water:Klonopin  DO NOT wear jewelry, eye make-up, lipstick,body lotion, or dark fingernail polish. Do not shave for 48 hours prior to surgery.  If you are to be admitted after surgery, leave suitcase in car until your room has been assigned.

## 2012-06-13 ENCOUNTER — Other Ambulatory Visit (HOSPITAL_COMMUNITY): Payer: Self-pay | Admitting: Obstetrics and Gynecology

## 2012-06-15 ENCOUNTER — Encounter (HOSPITAL_COMMUNITY): Payer: Self-pay | Admitting: Obstetrics and Gynecology

## 2012-06-15 DIAGNOSIS — O34219 Maternal care for unspecified type scar from previous cesarean delivery: Secondary | ICD-10-CM | POA: Diagnosis present

## 2012-06-15 NOTE — H&P (Signed)
Christina Lowe is a 26 y.o. female presenting for repeat cesarean section at 39 weeks 1 day after pregnancy course followed at [facility] since may 8, well dated by early first trimester ultrasounds. She does not desire any contraception at the time of cesarean, instead plans Depo-Provera in the future. 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 Initial UDS positive for THC subsequently negative. She has anxiety issues and takes Klonopin 1 mg daily OB History    Grav Para Term Preterm Abortions TAB SAB Ect Mult Living   4 2 1 1 1     2      Past Medical History  Diagnosis Date  . Anxiety     panic disorder  . Depression     has history- ok now  . Asthma     no inhaler   Past Surgical History  Procedure Date  . Cesarean section   . Appendectomy   . Diagnostic laparoscopy      Family History: family history is not on file. Social History:  reports that she has been smoking Cigarettes.  She has been smoking about .5 packs per day. She does not have any smokeless tobacco history on file. She reports that she does not drink alcohol or use illicit drugs.   Prenatal Transfer Tool  Maternal Diabetes: No Genetic Screening: Normal quad screen negative Maternal Ultrasounds/Referrals: Normal Fetal Ultrasounds or other Referrals:  None Maternal Substance Abuse:  Yes:  Type: Marijuana, Other:  used and noted positive in early pregnancy but subsequent negative patient reports she stopped Significant Maternal Medications:  Meds include: Other:  Klonopin 1 mg daily Significant Maternal Lab Results:  GBS positive 05/31/2012 Other Comments:  None  ROS    There were no vitals taken for this visit. Exam Physical Exam weight 210.6 pounds blood pressure 130/80 height 5 feet 9 Physical Examination: General appearance - alert, well appearing, and in no distress, oriented to person, place, and time and anxious Mental status -  alert, oriented to person, place, and time, normal mood, behavior, speech, dress, motor activity, and thought processes, affect appropriate to mood Abdomen - soft, nontender, nondistended, no masses or organomegaly Gravid uterus consistent with dates 37 cm fundal height at last visit Pelvic - CERVIX: normal appearing cervix without discharge or lesions Extremities - peripheral pulses normal, no pedal edema, no clubbing or cyanosis, Homan's sign negative bilaterally   Prenatal labs: ABO, Rh: --/--/B POS (12/16 1115) Antibody: NEG (12/16 1101) Rubella: Immune (06/04 0000) RPR: NON REACTIVE (12/16 1102)  HBsAg: Negative (06/04 0000)  HIV: Non-reactive (06/04 0000)  GBS:   positive  Assessment/Plan: Pregnancy 39 weeks 1 day prior cesarean section x2 for repeat cesarean section 06/16/2012 and 9 AM case #[id number] Mental aspects of procedure been reviewed to patient's satisfaction plans are to nuclear previous cesarean scar risk of infection bleeding are well known to patient   Christina Lowe V 06/15/2012, 11:05 AM

## 2012-06-16 ENCOUNTER — Inpatient Hospital Stay (HOSPITAL_COMMUNITY): Payer: Medicaid Other | Admitting: Anesthesiology

## 2012-06-16 ENCOUNTER — Inpatient Hospital Stay (HOSPITAL_COMMUNITY)
Admission: RE | Admit: 2012-06-16 | Discharge: 2012-06-18 | DRG: 766 | Disposition: A | Discharge status: Home / Self Care | Payer: Medicaid Other | Source: Ambulatory Visit | Attending: Obstetrics and Gynecology | Admitting: Obstetrics and Gynecology

## 2012-06-16 ENCOUNTER — Encounter (HOSPITAL_COMMUNITY): Payer: Self-pay | Admitting: *Deleted

## 2012-06-16 ENCOUNTER — Encounter (HOSPITAL_COMMUNITY)
Admission: RE | Disposition: A | Discharge status: Home / Self Care | Payer: Self-pay | Source: Ambulatory Visit | Attending: Obstetrics and Gynecology

## 2012-06-16 ENCOUNTER — Encounter (HOSPITAL_COMMUNITY): Payer: Self-pay | Admitting: Anesthesiology

## 2012-06-16 DIAGNOSIS — F319 Bipolar disorder, unspecified: Secondary | ICD-10-CM | POA: Diagnosis present

## 2012-06-16 DIAGNOSIS — O99344 Other mental disorders complicating childbirth: Secondary | ICD-10-CM | POA: Diagnosis present

## 2012-06-16 DIAGNOSIS — Z22330 Carrier of Group B streptococcus: Secondary | ICD-10-CM

## 2012-06-16 DIAGNOSIS — Z01818 Encounter for other preprocedural examination: Secondary | ICD-10-CM

## 2012-06-16 DIAGNOSIS — Z98891 History of uterine scar from previous surgery: Secondary | ICD-10-CM

## 2012-06-16 DIAGNOSIS — O99892 Other specified diseases and conditions complicating childbirth: Secondary | ICD-10-CM

## 2012-06-16 DIAGNOSIS — O34219 Maternal care for unspecified type scar from previous cesarean delivery: Secondary | ICD-10-CM

## 2012-06-16 DIAGNOSIS — Z01812 Encounter for preprocedural laboratory examination: Secondary | ICD-10-CM

## 2012-06-16 DIAGNOSIS — O9989 Other specified diseases and conditions complicating pregnancy, childbirth and the puerperium: Secondary | ICD-10-CM

## 2012-06-16 LAB — TYPE AND SCREEN

## 2012-06-16 SURGERY — Surgical Case
Anesthesia: Spinal | Site: Abdomen | Wound class: Clean Contaminated

## 2012-06-16 MED ORDER — IBUPROFEN 600 MG PO TABS
600.0000 mg | ORAL_TABLET | Freq: Four times a day (QID) | ORAL | Status: DC | PRN
  Filled 2012-06-16 (×4): qty 1

## 2012-06-16 MED ORDER — LACTATED RINGERS IV SOLN
INTRAVENOUS | Status: DC
  Administered 2012-06-16: 08:00:00 via INTRAVENOUS

## 2012-06-16 MED ORDER — FENTANYL CITRATE 0.05 MG/ML IJ SOLN
INTRAMUSCULAR | Status: DC | PRN
  Administered 2012-06-16: 85 ug via INTRAVENOUS

## 2012-06-16 MED ORDER — DIPHENHYDRAMINE HCL 25 MG PO CAPS: 25.0000 mg | ORAL_CAPSULE | Freq: Four times a day (QID) | ORAL | Status: DC | PRN

## 2012-06-16 MED ORDER — CEFAZOLIN SODIUM-DEXTROSE 2-3 GM-% IV SOLR: 2.0000 g | INTRAVENOUS | Status: DC

## 2012-06-16 MED ORDER — SCOPOLAMINE 1 MG/3DAYS TD PT72
MEDICATED_PATCH | TRANSDERMAL | Status: AC
  Administered 2012-06-16: 1.5 mg via TRANSDERMAL
  Filled 2012-06-16: qty 1

## 2012-06-16 MED ORDER — OXYTOCIN 10 UNIT/ML IJ SOLN
INTRAMUSCULAR | Status: AC
  Filled 2012-06-16: qty 4

## 2012-06-16 MED ORDER — CEFAZOLIN SODIUM-DEXTROSE 2-3 GM-% IV SOLR
INTRAVENOUS | Status: AC
  Filled 2012-06-16: qty 50

## 2012-06-16 MED ORDER — FENTANYL CITRATE 0.05 MG/ML IJ SOLN
INTRAMUSCULAR | Status: AC
  Filled 2012-06-16: qty 2

## 2012-06-16 MED ORDER — OXYTOCIN 40 UNITS IN LACTATED RINGERS INFUSION - SIMPLE MED: 62.5000 mL/h | INTRAVENOUS | Status: AC

## 2012-06-16 MED ORDER — METOCLOPRAMIDE HCL 5 MG/ML IJ SOLN: 10.0000 mg | Freq: Three times a day (TID) | INTRAMUSCULAR | Status: DC | PRN

## 2012-06-16 MED ORDER — ONDANSETRON HCL 4 MG/2ML IJ SOLN: 4.0000 mg | Freq: Three times a day (TID) | INTRAMUSCULAR | Status: DC | PRN

## 2012-06-16 MED ORDER — FENTANYL CITRATE 0.05 MG/ML IJ SOLN
25.0000 ug | INTRAMUSCULAR | Status: DC | PRN
  Administered 2012-06-16 (×3): 50 ug via INTRAVENOUS

## 2012-06-16 MED ORDER — PHENYLEPHRINE HCL 10 MG/ML IJ SOLN
INTRAMUSCULAR | Status: DC | PRN
  Administered 2012-06-16: 80 ug via INTRAVENOUS
  Administered 2012-06-16: 120 ug via INTRAVENOUS

## 2012-06-16 MED ORDER — ONDANSETRON HCL 4 MG/2ML IJ SOLN: 4.0000 mg | INTRAMUSCULAR | Status: DC | PRN

## 2012-06-16 MED ORDER — TETANUS-DIPHTH-ACELL PERTUSSIS 5-2.5-18.5 LF-MCG/0.5 IM SUSP: 0.5000 mL | Freq: Once | INTRAMUSCULAR | Status: DC

## 2012-06-16 MED ORDER — MORPHINE SULFATE (PF) 0.5 MG/ML IJ SOLN
INTRAMUSCULAR | Status: DC | PRN
  Administered 2012-06-16: .1 mg via INTRATHECAL

## 2012-06-16 MED ORDER — ACETAMINOPHEN 10 MG/ML IV SOLN
1000.0000 mg | Freq: Once | INTRAVENOUS | Status: AC | PRN
  Administered 2012-06-16: 1000 mg via INTRAVENOUS
  Filled 2012-06-16: qty 100

## 2012-06-16 MED ORDER — CEFAZOLIN SODIUM-DEXTROSE 2-3 GM-% IV SOLR
INTRAVENOUS | Status: DC | PRN
  Administered 2012-06-16: 2 g via INTRAVENOUS

## 2012-06-16 MED ORDER — FENTANYL CITRATE 0.05 MG/ML IJ SOLN
INTRAMUSCULAR | Status: AC
  Administered 2012-06-16: 50 ug via INTRAVENOUS
  Filled 2012-06-16: qty 2

## 2012-06-16 MED ORDER — LACTATED RINGERS IV SOLN
INTRAVENOUS | Status: DC
  Administered 2012-06-16: 22:00:00 via INTRAVENOUS
  Administered 2012-06-16: 125 mL/h via INTRAVENOUS

## 2012-06-16 MED ORDER — SIMETHICONE 80 MG PO CHEW: 80.0000 mg | CHEWABLE_TABLET | ORAL | Status: DC | PRN

## 2012-06-16 MED ORDER — OXYCODONE-ACETAMINOPHEN 5-325 MG PO TABS
1.0000 | ORAL_TABLET | ORAL | Status: DC | PRN
  Administered 2012-06-16: 2 via ORAL
  Administered 2012-06-17: 1 via ORAL
  Administered 2012-06-17 (×2): 2 via ORAL
  Administered 2012-06-17: 1 via ORAL
  Administered 2012-06-17 – 2012-06-18 (×3): 2 via ORAL
  Administered 2012-06-18 (×2): 1 via ORAL
  Filled 2012-06-16 (×3): qty 2
  Filled 2012-06-16: qty 1
  Filled 2012-06-16 (×4): qty 2
  Filled 2012-06-16: qty 1
  Filled 2012-06-16: qty 2

## 2012-06-16 MED ORDER — ONDANSETRON HCL 4 MG PO TABS: 4.0000 mg | ORAL_TABLET | ORAL | Status: DC | PRN

## 2012-06-16 MED ORDER — LANOLIN HYDROUS EX OINT: 1.0000 "application " | TOPICAL_OINTMENT | CUTANEOUS | Status: DC | PRN

## 2012-06-16 MED ORDER — SODIUM CHLORIDE 0.9 % IJ SOLN: 3.0000 mL | INTRAMUSCULAR | Status: DC | PRN

## 2012-06-16 MED ORDER — ZOLPIDEM TARTRATE 5 MG PO TABS: 5.0000 mg | ORAL_TABLET | Freq: Every evening | ORAL | Status: DC | PRN

## 2012-06-16 MED ORDER — NALBUPHINE HCL 10 MG/ML IJ SOLN
5.0000 mg | INTRAMUSCULAR | Status: DC | PRN
  Filled 2012-06-16: qty 1

## 2012-06-16 MED ORDER — MORPHINE SULFATE 0.5 MG/ML IJ SOLN
INTRAMUSCULAR | Status: AC
  Filled 2012-06-16: qty 10

## 2012-06-16 MED ORDER — PRENATAL MULTIVITAMIN CH
1.0000 | ORAL_TABLET | Freq: Every day | ORAL | Status: DC
  Administered 2012-06-16 – 2012-06-18 (×3): 1 via ORAL
  Filled 2012-06-16 (×3): qty 1

## 2012-06-16 MED ORDER — SIMETHICONE 80 MG PO CHEW
80.0000 mg | CHEWABLE_TABLET | Freq: Three times a day (TID) | ORAL | Status: DC
  Administered 2012-06-16 – 2012-06-18 (×7): 80 mg via ORAL

## 2012-06-16 MED ORDER — KETOROLAC TROMETHAMINE 60 MG/2ML IM SOLN
INTRAMUSCULAR | Status: AC
  Administered 2012-06-16: 60 mg via INTRAMUSCULAR
  Filled 2012-06-16: qty 2

## 2012-06-16 MED ORDER — PHENYLEPHRINE 40 MCG/ML (10ML) SYRINGE FOR IV PUSH (FOR BLOOD PRESSURE SUPPORT)
PREFILLED_SYRINGE | INTRAVENOUS | Status: AC
  Filled 2012-06-16: qty 10

## 2012-06-16 MED ORDER — SCOPOLAMINE 1 MG/3DAYS TD PT72
1.0000 | MEDICATED_PATCH | Freq: Once | TRANSDERMAL | Status: DC
  Filled 2012-06-16: qty 1

## 2012-06-16 MED ORDER — NALOXONE HCL 0.4 MG/ML IJ SOLN: 0.4000 mg | INTRAMUSCULAR | Status: DC | PRN

## 2012-06-16 MED ORDER — MEPERIDINE HCL 25 MG/ML IJ SOLN: 6.2500 mg | INTRAMUSCULAR | Status: DC | PRN

## 2012-06-16 MED ORDER — DIBUCAINE 1 % RE OINT: 1.0000 "application " | TOPICAL_OINTMENT | RECTAL | Status: DC | PRN

## 2012-06-16 MED ORDER — IBUPROFEN 600 MG PO TABS
600.0000 mg | ORAL_TABLET | Freq: Four times a day (QID) | ORAL | Status: DC
  Administered 2012-06-17 – 2012-06-18 (×6): 600 mg via ORAL
  Filled 2012-06-16: qty 1

## 2012-06-16 MED ORDER — DIPHENHYDRAMINE HCL 25 MG PO CAPS
25.0000 mg | ORAL_CAPSULE | ORAL | Status: DC | PRN
  Administered 2012-06-16: 25 mg via ORAL
  Filled 2012-06-16: qty 1

## 2012-06-16 MED ORDER — ONDANSETRON HCL 4 MG/2ML IJ SOLN
INTRAMUSCULAR | Status: DC | PRN
  Administered 2012-06-16: 4 mg via INTRAVENOUS

## 2012-06-16 MED ORDER — KETOROLAC TROMETHAMINE 30 MG/ML IJ SOLN: 30.0000 mg | Freq: Four times a day (QID) | INTRAMUSCULAR | Status: AC | PRN

## 2012-06-16 MED ORDER — FENTANYL CITRATE 0.05 MG/ML IJ SOLN
INTRAMUSCULAR | Status: DC | PRN
  Administered 2012-06-16: 15 ug via INTRATHECAL

## 2012-06-16 MED ORDER — BUPIVACAINE IN DEXTROSE 0.75-8.25 % IT SOLN
INTRATHECAL | Status: DC | PRN
  Administered 2012-06-16: 1.5 mL via INTRATHECAL

## 2012-06-16 MED ORDER — DIPHENHYDRAMINE HCL 50 MG/ML IJ SOLN: 25.0000 mg | INTRAMUSCULAR | Status: DC | PRN

## 2012-06-16 MED ORDER — KETOROLAC TROMETHAMINE 30 MG/ML IJ SOLN
30.0000 mg | Freq: Four times a day (QID) | INTRAMUSCULAR | Status: AC | PRN
  Administered 2012-06-16: 30 mg via INTRAVENOUS
  Filled 2012-06-16: qty 1

## 2012-06-16 MED ORDER — NALOXONE HCL 1 MG/ML IJ SOLN
1.0000 ug/kg/h | INTRAVENOUS | Status: DC | PRN
  Filled 2012-06-16: qty 2

## 2012-06-16 MED ORDER — SENNOSIDES-DOCUSATE SODIUM 8.6-50 MG PO TABS
2.0000 | ORAL_TABLET | Freq: Every day | ORAL | Status: DC
  Administered 2012-06-16 – 2012-06-17 (×2): 2 via ORAL

## 2012-06-16 MED ORDER — KETOROLAC TROMETHAMINE 60 MG/2ML IM SOLN
60.0000 mg | Freq: Once | INTRAMUSCULAR | Status: AC | PRN
  Administered 2012-06-16: 60 mg via INTRAMUSCULAR

## 2012-06-16 MED ORDER — DIPHENHYDRAMINE HCL 50 MG/ML IJ SOLN: 12.5000 mg | INTRAMUSCULAR | Status: DC | PRN

## 2012-06-16 MED ORDER — CLONAZEPAM 0.5 MG PO TABS
0.5000 mg | ORAL_TABLET | Freq: Two times a day (BID) | ORAL | Status: DC
  Administered 2012-06-16 – 2012-06-18 (×4): 0.5 mg via ORAL
  Filled 2012-06-16 (×5): qty 1

## 2012-06-16 MED ORDER — WITCH HAZEL-GLYCERIN EX PADS: 1.0000 "application " | MEDICATED_PAD | CUTANEOUS | Status: DC | PRN

## 2012-06-16 MED ORDER — SCOPOLAMINE 1 MG/3DAYS TD PT72
1.0000 | MEDICATED_PATCH | Freq: Once | TRANSDERMAL | Status: DC
  Administered 2012-06-16: 1.5 mg via TRANSDERMAL

## 2012-06-16 MED ORDER — OXYTOCIN 10 UNIT/ML IJ SOLN
40.0000 [IU] | INTRAVENOUS | Status: DC | PRN
  Administered 2012-06-16: 40 [IU] via INTRAVENOUS

## 2012-06-16 MED ORDER — LACTATED RINGERS IV SOLN
INTRAVENOUS | Status: DC | PRN
  Administered 2012-06-16 (×3): via INTRAVENOUS

## 2012-06-16 MED ORDER — ONDANSETRON HCL 4 MG/2ML IJ SOLN
INTRAMUSCULAR | Status: AC
  Filled 2012-06-16: qty 2

## 2012-06-16 MED ORDER — MENTHOL 3 MG MT LOZG: 1.0000 | LOZENGE | OROMUCOSAL | Status: DC | PRN

## 2012-06-16 MED ORDER — MORPHINE SULFATE (PF) 0.5 MG/ML IJ SOLN
INTRAMUSCULAR | Status: DC | PRN
  Administered 2012-06-16 (×2): 1 mg via INTRAVENOUS
  Administered 2012-06-16: .9 mg via INTRAVENOUS
  Administered 2012-06-16: 1 mg via EPIDURAL

## 2012-06-16 SURGICAL SUPPLY — 27 items
APL SKNCLS STERI-STRIP NONHPOA (GAUZE/BANDAGES/DRESSINGS) ×1
BENZOIN TINCTURE PRP APPL 2/3 (GAUZE/BANDAGES/DRESSINGS) ×2 IMPLANT
CLOTH BEACON ORANGE TIMEOUT ST (SAFETY) ×2 IMPLANT
DRAPE LG THREE QUARTER DISP (DRAPES) ×2 IMPLANT
DRSG OPSITE POSTOP 4X10 (GAUZE/BANDAGES/DRESSINGS) ×2 IMPLANT
DURAPREP 26ML APPLICATOR (WOUND CARE) ×2 IMPLANT
ELECT REM PT RETURN 9FT ADLT (ELECTROSURGICAL) ×2
ELECTRODE REM PT RTRN 9FT ADLT (ELECTROSURGICAL) ×1 IMPLANT
GLOVE BIO SURGEON ST LM GN SZ9 (GLOVE) ×2 IMPLANT
GLOVE BIOGEL PI IND STRL 9 (GLOVE) ×2 IMPLANT
GLOVE BIOGEL PI INDICATOR 9 (GLOVE) ×2
GOWN PREVENTION PLUS LG XLONG (DISPOSABLE) ×2 IMPLANT
GOWN PREVENTION PLUS XLARGE (GOWN DISPOSABLE) ×2 IMPLANT
GOWN STRL REIN 3XL LVL4 (GOWN DISPOSABLE) ×2 IMPLANT
NEEDLE HYPO 25X5/8 SAFETYGLIDE (NEEDLE) IMPLANT
NS IRRIG 1000ML POUR BTL (IV SOLUTION) ×2 IMPLANT
PACK C SECTION WH (CUSTOM PROCEDURE TRAY) ×2 IMPLANT
PAD OB MATERNITY 4.3X12.25 (PERSONAL CARE ITEMS) ×2 IMPLANT
STRIP CLOSURE SKIN 1/2X4 (GAUZE/BANDAGES/DRESSINGS) ×2 IMPLANT
SUT CHROMIC 0 CTX 36 (SUTURE) ×4 IMPLANT
SUT VIC AB 0 CT1 27 (SUTURE) ×1
SUT VIC AB 0 CT1 27XBRD ANBCTR (SUTURE) ×1 IMPLANT
SUT VIC AB 2-0 CT1 27 (SUTURE) ×4
SUT VIC AB 2-0 CT1 TAPERPNT 27 (SUTURE) ×2 IMPLANT
SUT VIC AB 4-0 KS 27 (SUTURE) ×2 IMPLANT
TOWEL OR 17X24 6PK STRL BLUE (TOWEL DISPOSABLE) ×2 IMPLANT
TRAY FOLEY CATH 14FR (SET/KITS/TRAYS/PACK) ×2 IMPLANT

## 2012-06-16 NOTE — Op Note (Signed)
See the operative details included in the brief operative note 

## 2012-06-16 NOTE — Anesthesia Postprocedure Evaluation (Signed)
  Anesthesia Post-op Note  Patient: Christina Lowe  Procedure(s) Performed: Procedure(s) (LRB) with comments: CESAREAN SECTION (N/A)  Patient Location: PACU and Mother/Baby  Anesthesia Type:Spinal  Level of Consciousness: awake, alert  and oriented  Airway and Oxygen Therapy: Patient Spontanous Breathing    Post-op Assessment: Patient's Cardiovascular Status Stable and Respiratory Function Stable  Post-op Vital Signs: stable  Complications: No apparent anesthesia complications

## 2012-06-16 NOTE — Anesthesia Procedure Notes (Signed)
Spinal  Patient location during procedure: OR Start time: 06/16/2012 9:34 AM Reason for block: procedure for pain Staffing Performed by: anesthesiologist  Preanesthetic Checklist Completed: patient identified, site marked, surgical consent, pre-op evaluation, timeout performed, IV checked, risks and benefits discussed and monitors and equipment checked Spinal Block Patient position: sitting Prep: site prepped and draped and DuraPrep Patient monitoring: heart rate, cardiac monitor, continuous pulse ox and blood pressure Approach: midline Location: L3-4 Injection technique: single-shot Needle Needle type: Sprotte  Needle gauge: 24 G Needle length: 9 cm Assessment Sensory level: T4 Additional Notes Clear free flow CSF on first attempt.  No paresthesia.  Patient tolerated procedure well.  Jasmine December, MD

## 2012-06-16 NOTE — Brief Op Note (Signed)
06/16/2012  10:51 AM  PATIENT:  Christina Lowe  26 y.o. female  PRE-OPERATIVE DIAGNOSIS:  Previous Cesarean Section x 2 , pregnancy 39+1 weeks   POST-OPERATIVE DIAGNOSIS:  Previous Cesarean Section x2 , pregnancy 39+1 weeks   PROCEDURE:  Procedure(s) (LRB) with comments: CESAREAN SECTION (N/A) Repeat low transverse  SURGEON:  Surgeon(s) and Role:    * Tilda Burrow, MD - Primary  PHYSICIAN ASSISTANT:   ASSISTANTS: [place] CST-FA   ANESTHESIA:   spinal  EBL:  Total I/O In: 2400 [I.V.:2400] Out: 900 [Urine:200; Blood:700]  BLOOD ADMINISTERED:none  DRAINS: Urinary Catheter (Foley)   LOCAL MEDICATIONS USED:  NONE  SPECIMEN:  Source of Specimen:  placenta to L&D  DISPOSITION OF SPECIMEN:  L&D  COUNTS:  YES  TOURNIQUET:  * No tourniquets in log *  DICTATION: .Dragon Dictation Patient was taken to the operating room prepped and draped for lower surgery after Foley catheter inserted under spinal anesthesia which was introduced without difficulty. Timeout was conducted during prep time. The previous old cicatrix was excised family, and standard Pfannenstiel technique used to enter the abdominal cavity. There was no adhesions on the anterior abdominal wall or bladder flap area. Bladder flap was developed anteriorly transverse incision made lower uterine segment which was quite thin, extended transversely with the index finger traction on the fetal vertex rotated into the incision and delivered with fundal pressure. Cord was clamped and the female infant healthy was cared for by pediatrician see their notes for details. Postoperative delivered intact Tomasa Blase presentation three-vessel cord confirmed. Uterus was irrigated and inspected for membranes, and minimal were found on the anterior lower uterine segment and removed. Single-layer running locking closure the uterine incision resulted in good hemostasis except at the left corner where a single additional interrupted suture cephalad  to the incision closure was required. Bladder flap was loosely reapproximated with 3 interrupted sutures of 2-0 Vicryl, the anterior abdomen inspected confirmed as static without significant fluid, and anterior peritoneum closed using running 2-0 Vicryl, fascia was closed using running 0 Vicryl, And subcutaneous tissues irrigated copiously with saline solution, then closed using interrupted 2-0 Vicryl x4 sutures, and then subcuticular 4-0 Vicryl close the skin and Steri-Strips with benzoin were applied sponge and needle counts correct EBL 500 cc  PLAN OF CARE: Admit to inpatient   PATIENT DISPOSITION:  PACU - hemodynamically stable.   Delay start of Pharmacological VTE agent (>24hrs) due to surgical blood loss or risk of bleeding: not applicable

## 2012-06-16 NOTE — Anesthesia Preprocedure Evaluation (Signed)
Anesthesia Evaluation  Patient identified by MRN, date of birth, ID band Patient awake    Reviewed: Allergy & Precautions, H&P , NPO status , Patient's Chart, lab work & pertinent test results, reviewed documented beta blocker date and time   History of Anesthesia Complications Negative for: history of anesthetic complications  Airway Mallampati: III TM Distance: >3 FB Neck ROM: full    Dental  (+) Teeth Intact and Poor Dentition   Pulmonary asthma (no recent inhaler use) , Current Smoker (1/2 ppd),  breath sounds clear to auscultation        Cardiovascular negative cardio ROS  Rhythm:regular Rate:Normal     Neuro/Psych PSYCHIATRIC DISORDERS (h/o anxiety, on klonopin) negative neurological ROS     GI/Hepatic negative GI ROS, Neg liver ROS,   Endo/Other  negative endocrine ROS  Renal/GU negative Renal ROS  negative genitourinary   Musculoskeletal   Abdominal   Peds  Hematology negative hematology ROS (+)   Anesthesia Other Findings   Reproductive/Obstetrics (+) Pregnancy (h/o c/s x2)                           Anesthesia Physical Anesthesia Plan  ASA: II  Anesthesia Plan: Spinal   Post-op Pain Management:    Induction:   Airway Management Planned:   Additional Equipment:   Intra-op Plan:   Post-operative Plan:   Informed Consent: I have reviewed the patients History and Physical, chart, labs and discussed the procedure including the risks, benefits and alternatives for the proposed anesthesia with the patient or authorized representative who has indicated his/her understanding and acceptance.     Plan Discussed with: Surgeon and CRNA  Anesthesia Plan Comments:         Anesthesia Quick Evaluation

## 2012-06-16 NOTE — Addendum Note (Signed)
Addendum  created 06/16/12 2124 by Len Blalock, CRNA   Modules edited:Notes Section

## 2012-06-16 NOTE — Transfer of Care (Signed)
Immediate Anesthesia Transfer of Care Note  Patient: Christina Lowe  Procedure(s) Performed: Procedure(s) (LRB) with comments: CESAREAN SECTION (N/A)  Patient Location: PACU  Anesthesia Type:Spinal  Level of Consciousness: awake, alert  and oriented  Airway & Oxygen Therapy: Patient Spontanous Breathing  Post-op Assessment: Report given to PACU RN and Post -op Vital signs reviewed and stable  Post vital signs: stable  Complications: No apparent anesthesia complications

## 2012-06-16 NOTE — Anesthesia Postprocedure Evaluation (Signed)
Anesthesia Post Note  Patient: Christina Lowe  Procedure(s) Performed: Procedure(s) (LRB): CESAREAN SECTION (N/A)  Anesthesia type: Spinal  Patient location: PACU  Post pain: Pain level controlled  Post assessment: Post-op Vital signs reviewed  Last Vitals:  Filed Vitals:   06/16/12 1330  BP: 122/71  Pulse: 76  Temp:   Resp: 16    Post vital signs: Reviewed  Level of consciousness: awake  Complications: No apparent anesthesia complications

## 2012-06-17 DIAGNOSIS — Z98891 History of uterine scar from previous surgery: Secondary | ICD-10-CM

## 2012-06-17 LAB — CBC
Hemoglobin: 10.6 g/dL — ABNORMAL LOW (ref 12.0–15.0)
MCH: 31.4 pg (ref 26.0–34.0)
RBC: 3.38 MIL/uL — ABNORMAL LOW (ref 3.87–5.11)

## 2012-06-17 NOTE — Progress Notes (Signed)
Notified Pam,CMW that patient states that percocet is not effective for her pain control. She will check with Dr. Debroah Loop if there is another option for the patient.

## 2012-06-17 NOTE — Clinical Social Work Note (Signed)
Clinical Social Work Department PSYCHOSOCIAL ASSESSMENT - MATERNAL/CHILD 06/17/2012  Patient:  Christina Lowe, Christina Lowe  Account Number:  [id number]  Admit Date:  06/16/2012  Marjo Bicker Name:   Sid Falcon    Clinical Social Worker:  Truman Hayward, LCSW   Date/Time:  06/17/2012 03:00 PM  Date Referred:  06/17/2012   Referral source  Physician  RN     Referred reason  Substance Abuse  Depression/Anxiety   Other referral source:    I:  FAMILY / HOME ENVIRONMENT Child's legal guardian:  PARENT  Guardian - Name Guardian - Age Guardian - Address  Christina Lowe 26 [address] [facility] [place] [address]  Corey Harold     Other household support members/support persons Name Relationship DOB  8 yo SISTER   3yo SISTER    Other support:    II  PSYCHOSOCIAL DATA Information Source:  Patient Interview  Insurance claims handler Resources Employment:   MOB just obtained GED, seeking employment   Financial resources:  Medicaid If Medicaid - [employer]:  [employer] Other  [employer] Stamps   School / Grade:   Maternity Care Coordinator / Child Services Coordination / Early Interventions:  Cultural issues impacting care:    III  STRENGTHS Strengths  Home prepared for Child (including basic supplies)   Strength comment:    IV  RISK FACTORS AND CURRENT PROBLEMS Current Problem:  None   Risk Factor & Current Problem Patient Issue Family Issue Risk Factor / Current Problem Comment   N N     V  SOCIAL WORK ASSESSMENT CSW spoke with MOB and FOB in room.  MOB reports no emotional concerns at this time.  CSW discussed hx with MOB.  MOB reports anxiety symptoms and using medication management for this.  CSW discussed MJ use.  MOB reports this was due to anxiety at times and she does not use frequently and not since September.  CSW discussed hospital policy for drug screen and UDS results, as well as MEC follow-up if needed.  MOB was understanding.  CSW discussed supplies.  MOB reports may needing assistance  with diapers and will let CSW know.  MOB also wants carseat and house coverage will assist when infant is ready for discharge. MOB has foodstamps, wic and no financial concerns at this time with providing for 3 children.  She reports little family support, however has friends and good support through them.  Please reconsult CSW if further needs arise.      VI SOCIAL WORK PLAN Social Work Plan  No Further Intervention Required / No Barriers to Discharge   Type of pt/family education:   If child protective services report - county:   If child protective services report - date:   Information/referral to community resources comment:   Other social work plan:

## 2012-06-17 NOTE — Progress Notes (Signed)
Subjective: Postpartum Day 1: Cesarean Delivery Patient reports incisional pain, tolerating PO, + flatus and no problems voiding.    Objective: Vital signs in last 24 hours: Temp:  [97.8 F (36.6 C)-98.8 F (37.1 C)] 97.9 F (36.6 C) (12/22 0415) Pulse Rate:  [56-86] 74  (12/22 0415) Resp:  [10-20] 20  (12/22 0415) BP: (101-130)/(53-79) 102/69 mmHg (12/22 0415) SpO2:  [94 %-100 %] 97 % (12/22 0415) Weight:  [211 lb (95.709 kg)] 211 lb (95.709 kg) (12/21 1427)  Physical Exam:  General: alert and no distress Lochia: appropriate Uterine Fundus: firm Incision: no significant drainage DVT Evaluation: No evidence of DVT seen on physical exam.   Basename 06/17/12 0550  HGB 10.6*  HCT 32.2*    Assessment/Plan: Status post Cesarean section. Doing well postoperatively.  Continue current care Foley already out, will d/c heplock .  [facility] [place] [facility] 06/17/2012, 6:44 AM

## 2012-06-18 ENCOUNTER — Encounter (HOSPITAL_COMMUNITY): Payer: Self-pay | Admitting: Obstetrics and Gynecology

## 2012-06-18 MED ORDER — OXYCODONE-ACETAMINOPHEN 5-325 MG PO TABS: 1.0000 | ORAL_TABLET | Freq: Four times a day (QID) | ORAL | Status: DC | PRN

## 2012-06-18 MED ORDER — CLONAZEPAM 0.5 MG PO TABS: 0.5000 mg | ORAL_TABLET | Freq: Three times a day (TID) | ORAL | Status: DC

## 2012-06-18 MED ORDER — IBUPROFEN 600 MG PO TABS: 600.0000 mg | ORAL_TABLET | Freq: Four times a day (QID) | ORAL | Status: DC | PRN

## 2012-06-18 NOTE — Progress Notes (Signed)
Ur chart review completed.  

## 2012-06-18 NOTE — Discharge Summary (Signed)
Physician Discharge Summary  Patient ID: Christina Lowe MRN: [id number] DOB/AGE: 08-29-1985 26 y.o.  Admit date: 06/16/2012 Discharge date: 06/18/2012  Admission Diagnoses:Assessment/Plan:  Pregnancy 39 weeks 1 day prior cesarean section x2 for repeat cesarean section 06/16/2012 and 9 AM case #[id number] Bipolar Type I   Discharge Diagnoses:  Principal Problem:  *Previous cesarean delivery x2  affecting pregnancy Active Problems:  Status post cesarean delivery Bipolar I stable  Discharged Condition: good  Hospital Course: Patient was admitted at 39 weeks 1 day for repeat cesarean section with hemoglobin 10.7 hematocrit 32.8, and underwent an uncomplicated repeat cesarean section with 600 cc estimated blood loss, possibly less. Postop hemoglobin was essentially unchanged at 10.6 hematocrit 32.2 The patient was afebrile throughout her postop course tolerated regular diet on postop day 1, breast-fed with bottle supplementation, and as expected had multiple concerns due to her anxieties and bipolar tendencies. She complained of abdominal wall discomfort though exam was completely normal, and this will be treated with an abdominal binder. She complained of inadequate pain relief but would only  take one Percocet at a time,, rejected Vicodin as ineffective. She does seem to do well by taking 1 Percocet and 1 Motrin together. Consults: None  Significant Diagnostic Studies: labs:  CBC    Component Value Date/Time   WBC 13.5* 06/17/2012 0550   RBC 3.38* 06/17/2012 0550   HGB 10.6* 06/17/2012 0550   HCT 32.2* 06/17/2012 0550   PLT 365 06/17/2012 0550   MCV 95.3 06/17/2012 0550   MCH 31.4 06/17/2012 0550   MCHC 32.9 06/17/2012 0550   RDW 13.9 06/17/2012 0550   LYMPHSABS 1.3 01/08/2008 2113   MONOABS 0.6 01/08/2008 2113   EOSABS 0.0 01/08/2008 2113   BASOSABS 0.0 01/08/2008 2113     Treatments: surgery: Repeat cesarean section, notable for complete absence of abdominal adhesions.  Discharge  Exam: Blood pressure 129/82, pulse 81, temperature 97.5 F (36.4 C), temperature source Oral, resp. rate 20, weight 211 lb (95.709 kg), SpO2 98.00%, unknown if currently breastfeeding. General appearance: alert, cooperative and Somewhat hyper vigilant Head: Normocephalic, without obvious abnormality, atraumatic Resp: clear to auscultation bilaterally GI: soft, non-tender; bowel sounds normal; no masses,  no organomegaly Extremities: extremities normal, atraumatic, no cyanosis or edema and Homans sign is negative, no sign of DVT Neurologic: Alert and oriented X 3, normal strength and tone. Normal symmetric reflexes. Normal coordination and gait Mental status: Alert, oriented, thought content appropriate, Multiple small anxieties. Patient responds appropriately when counseled regarding her multiple worries  Disposition: 01-Home or Self Care     Medication List     As of 06/18/2012  7:37 AM    ASK your doctor about these medications         clonazePAM 0.5 MG tablet   Commonly known as: KLONOPIN   Take 0.5 mg by mouth 2 (two) times daily.       we'll discharge Klonopin 0.5 mg 3 times daily, Percocet 5/325 one every 6 hours, may take 2 if needed, Motrin 600 mg every 6 hours.   SignedTilda Burrow 06/18/2012, 7:37 AM

## 2013-02-05 ENCOUNTER — Other Ambulatory Visit: Payer: Self-pay | Admitting: Obstetrics and Gynecology

## 2013-03-08 ENCOUNTER — Telehealth: Payer: Self-pay | Admitting: Adult Health

## 2013-03-08 NOTE — Telephone Encounter (Signed)
Pt stated that Dr. Emelda Fear usually gives her the rx for DEPO. Pt stated she also needed a pap so the pt was transferred up front to make that appointment.

## 2013-04-03 ENCOUNTER — Other Ambulatory Visit: Payer: Self-pay | Admitting: Obstetrics and Gynecology

## 2013-04-18 ENCOUNTER — Other Ambulatory Visit: Payer: Self-pay | Admitting: Obstetrics and Gynecology

## 2013-04-18 ENCOUNTER — Encounter: Payer: Self-pay | Admitting: *Deleted

## 2013-06-03 ENCOUNTER — Other Ambulatory Visit: Payer: Self-pay | Admitting: Obstetrics and Gynecology

## 2013-06-04 ENCOUNTER — Telehealth: Payer: Self-pay | Admitting: *Deleted

## 2013-06-04 NOTE — Telephone Encounter (Signed)
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 Emelda Fear in case it is female problems because it has been going on since she had her last child a year ago, I offer pt 06-19-13 but she did not want the appt. On christmas eve. The next day I had to give her was 07-02-13 and she accepted and was okay with that appt. Pt cancelled the last appt. She had here in our office. Pt also stated she has been going to her PCP about this problem and nothing is getting done about it but pt states something is wrong.

## 2013-06-04 NOTE — Telephone Encounter (Signed)
Pt was offered December 24th appt and she stated that she has children and can't come in until January 6.

## 2013-06-14 IMAGING — CR DG KNEE COMPLETE 4+V*L*
4 series · 4 of 4 positions shown · non-contrast
Comparison: 03/15/2008

CLINICAL DATA: Post fall, now with medial knee pain and swelling.

LEFT KNEE - COMPLETE 4+ VIEW

[view not recorded (1 of 4)]
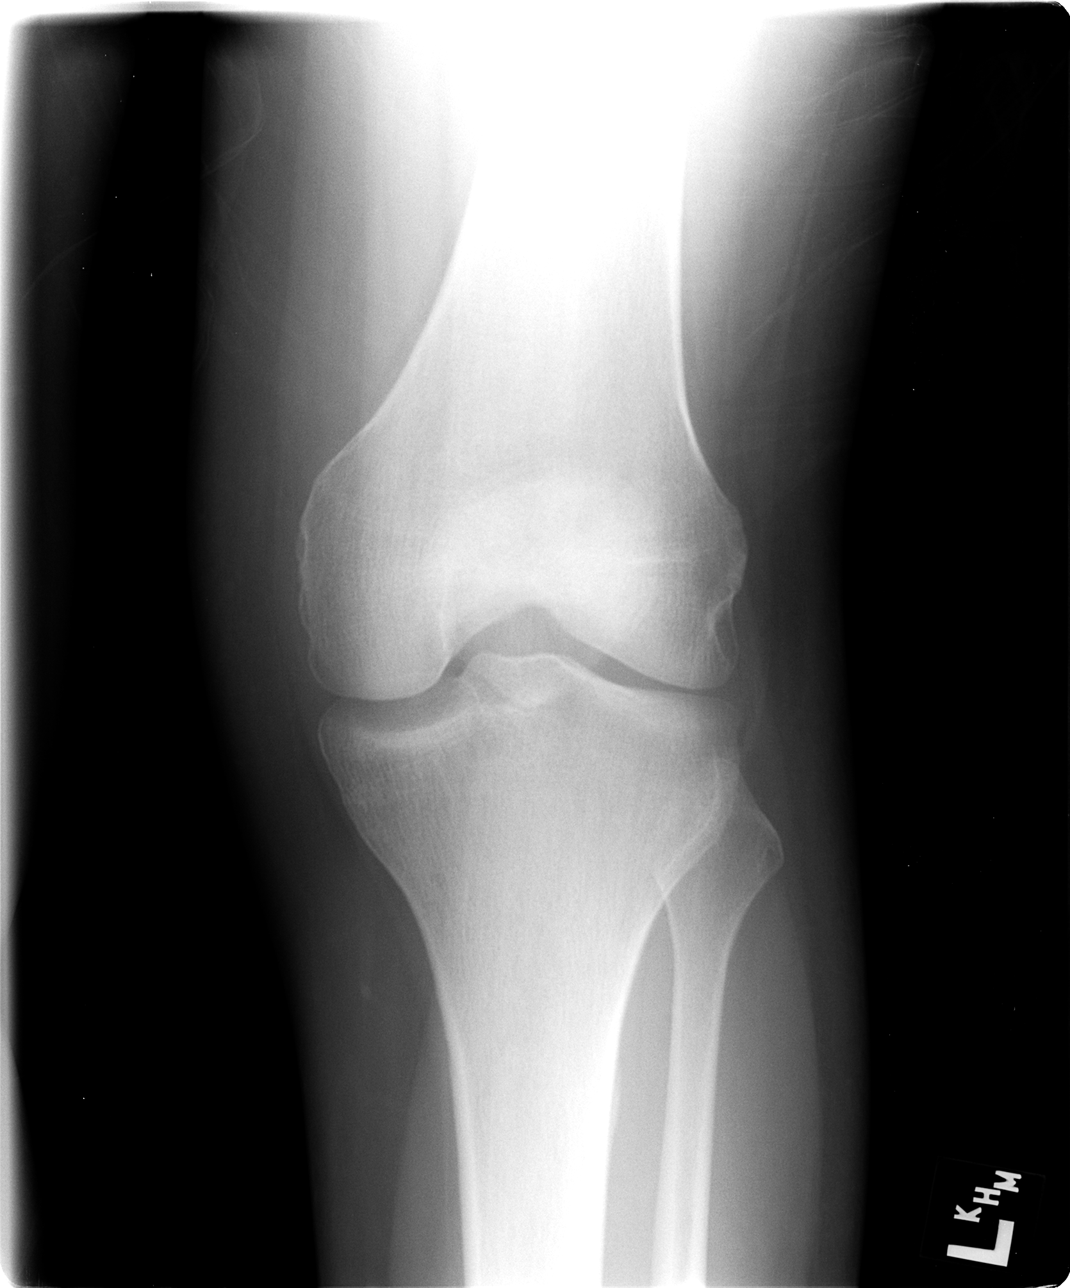

[view not recorded (2 of 4)]
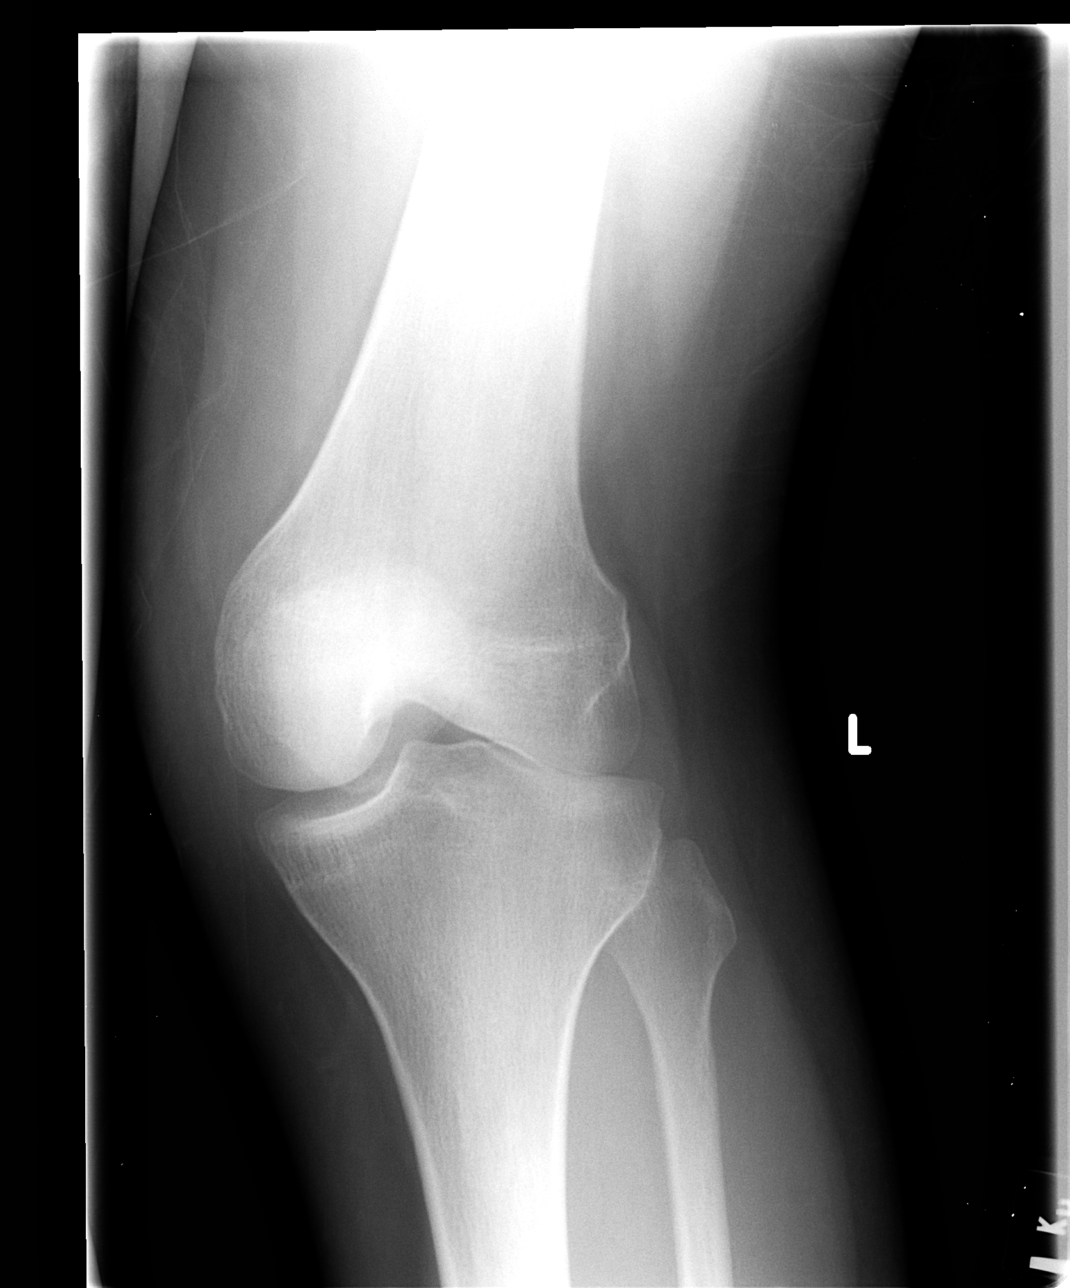

[view not recorded (3 of 4)]
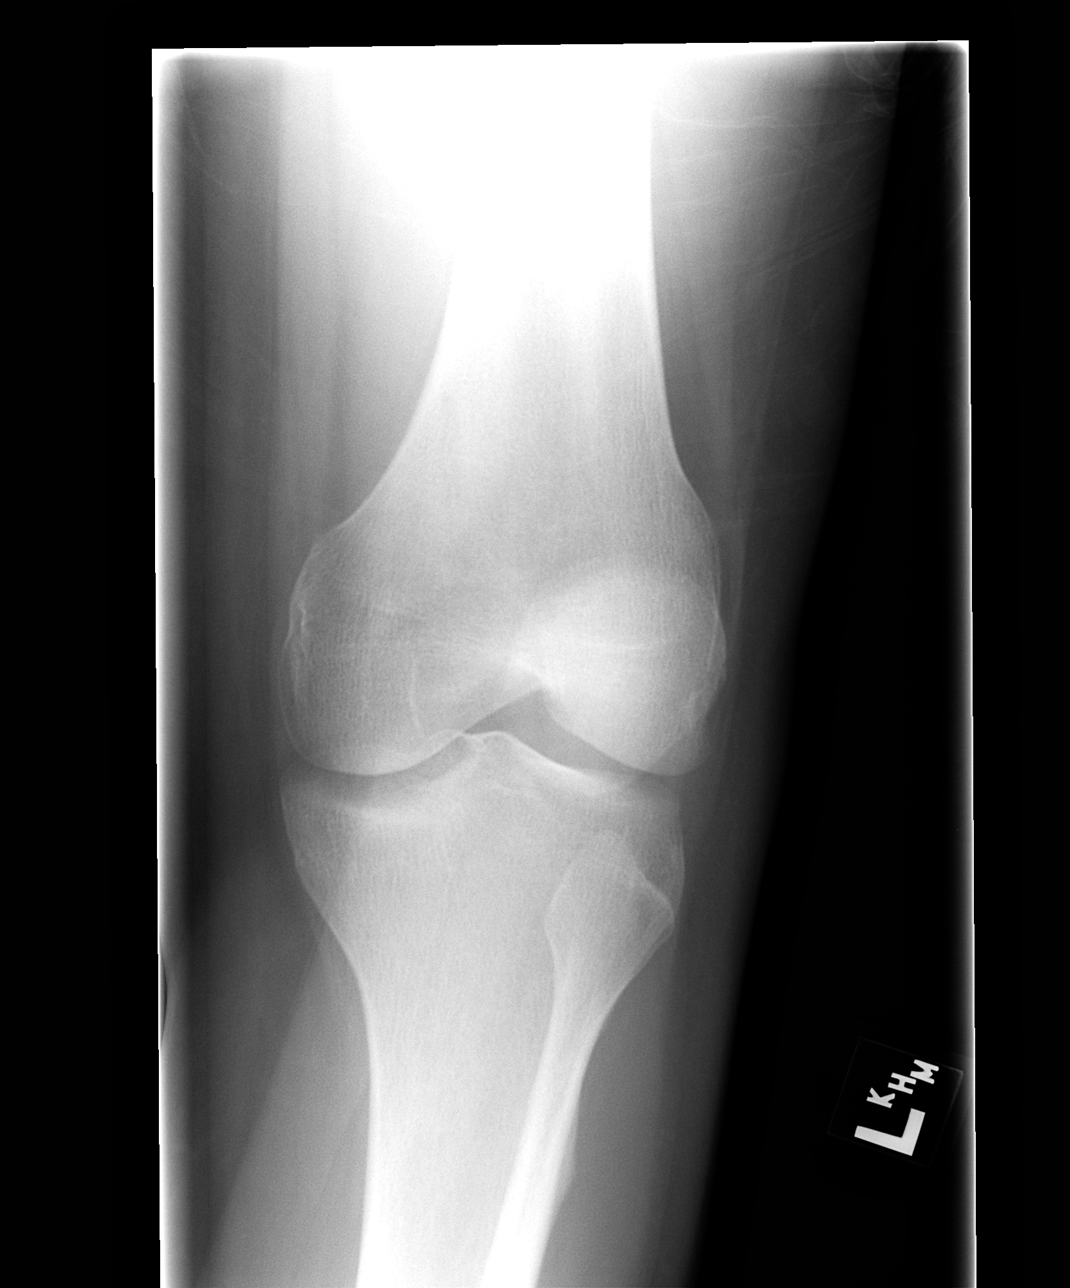

[view not recorded (4 of 4)]
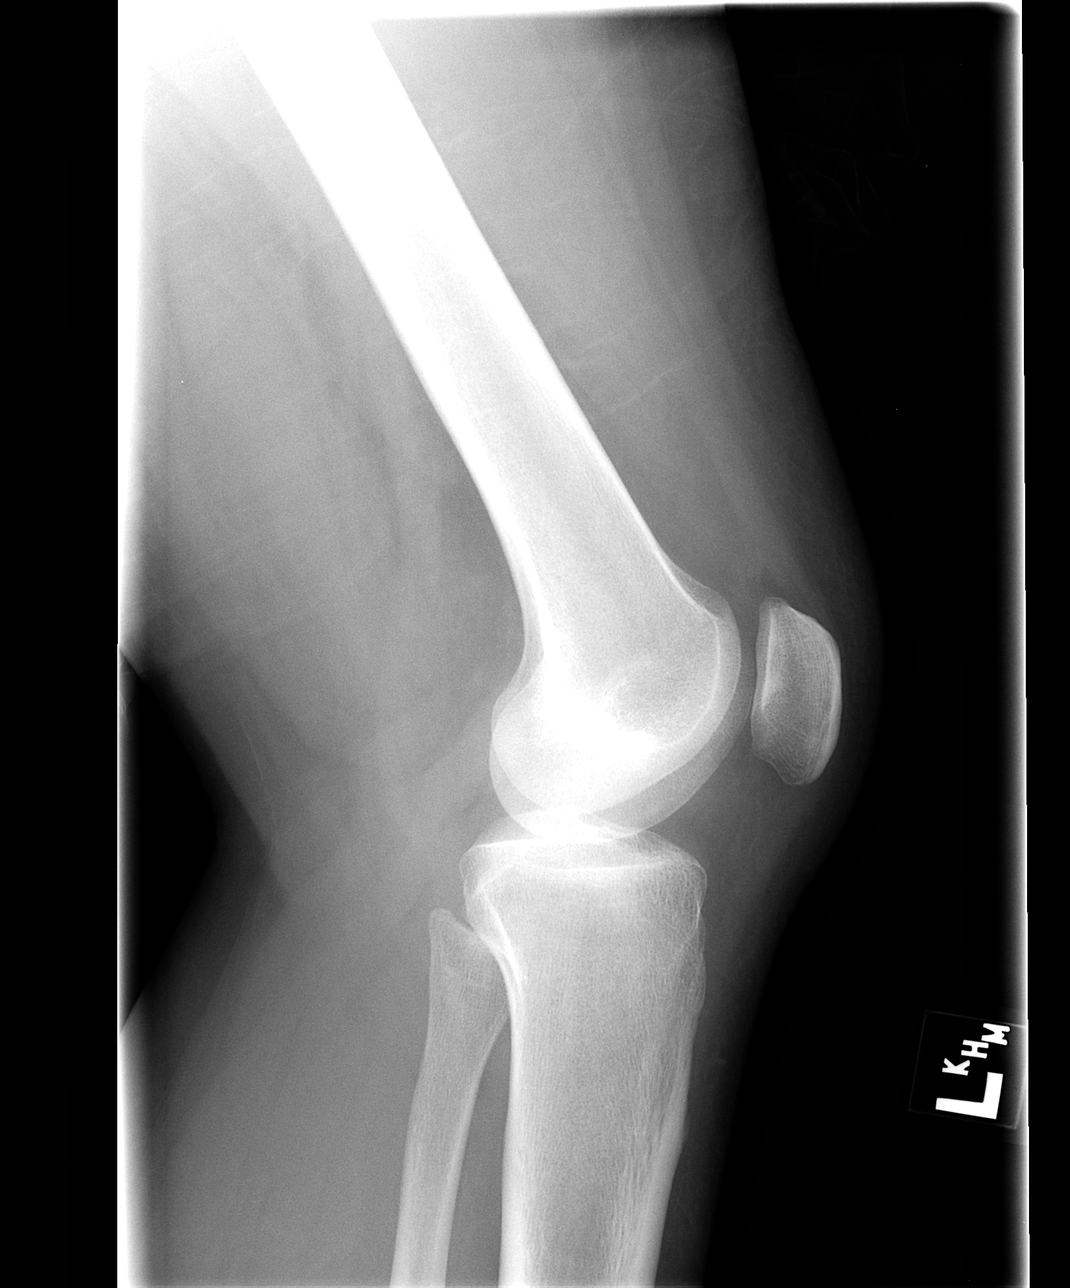

[4 of 4 positions shown; findings below may reference images not displayed]

FINDINGS: No fracture or dislocation.  No joint effusion.  Joint
spaces are preserved.  Regional soft tissues are normal.
IMPRESSION: Normal radiographs of the left knee.

## 2013-06-26 ENCOUNTER — Ambulatory Visit (INDEPENDENT_AMBULATORY_CARE_PROVIDER_SITE_OTHER): Payer: Medicaid Other | Admitting: Obstetrics and Gynecology

## 2013-06-26 ENCOUNTER — Encounter: Payer: Self-pay | Admitting: Obstetrics and Gynecology

## 2013-06-26 ENCOUNTER — Encounter (INDEPENDENT_AMBULATORY_CARE_PROVIDER_SITE_OTHER): Payer: Self-pay

## 2013-06-26 ENCOUNTER — Other Ambulatory Visit (HOSPITAL_COMMUNITY)
Admission: RE | Admit: 2013-06-26 | Discharge: 2013-06-26 | Disposition: A | Discharge status: Home / Self Care | Payer: Medicaid Other | Source: Ambulatory Visit | Attending: Obstetrics and Gynecology | Admitting: Obstetrics and Gynecology

## 2013-06-26 VITALS — BP 124/83 | Ht 69.0 in | Wt 197.0 lb

## 2013-06-26 DIAGNOSIS — Z0000 Encounter for general adult medical examination without abnormal findings: Secondary | ICD-10-CM

## 2013-06-26 DIAGNOSIS — Z124 Encounter for screening for malignant neoplasm of cervix: Secondary | ICD-10-CM | POA: Insufficient documentation

## 2013-06-26 DIAGNOSIS — R87810 Cervical high risk human papillomavirus (HPV) DNA test positive: Secondary | ICD-10-CM | POA: Insufficient documentation

## 2013-06-26 DIAGNOSIS — Z113 Encounter for screening for infections with a predominantly sexual mode of transmission: Secondary | ICD-10-CM | POA: Insufficient documentation

## 2013-06-26 DIAGNOSIS — R87619 Unspecified abnormal cytological findings in specimens from cervix uteri: Secondary | ICD-10-CM | POA: Insufficient documentation

## 2013-06-26 DIAGNOSIS — Z01419 Encounter for gynecological examination (general) (routine) without abnormal findings: Secondary | ICD-10-CM

## 2013-06-26 DIAGNOSIS — Z1151 Encounter for screening for human papillomavirus (HPV): Secondary | ICD-10-CM | POA: Insufficient documentation

## 2013-06-26 MED ORDER — MEDROXYPROGESTERONE ACETATE 150 MG/ML IM SUSP: 150.0000 mg | INTRAMUSCULAR | Status: DC

## 2013-06-26 NOTE — Progress Notes (Signed)
Patient ID: Christina Lowe, female   DOB: [birth year], 27 y.o.   MRN: [id number] Pt here today for yearly exam. Pt states that she needs a refill on her DEPO.   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 pap smear done, Will schedule for colposcopy 2. return annually or prn 3    Subjective:  Christina Lowe is a 27 y.o. female [phone] who presents for annual exam. No LMP recorded. Patient has had an injection. The patient has complaints today of none, on ByVance 70 mg daily and Klonipin 1 mg bid.  Pt HIGHLY ANXIOUS , ,not likely a candidate for LEEP if tx needed, would require sedation or GA. Pt seen at [facility] for 2 cm area of erythema and excoriation in the back of right thigh.    The following portions of the patient's history were reviewed and updated as appropriate: allergies, current medications, past family history, past medical history, past social history, past surgical history and problem list.  Review of Systems Constitutional: negative Gastrointestinal: positive for chronic rlq pain, despite c/s x 3 not identifying pain source, and laparoscopic appy(jenkins 2012) Genitourinary: hx abnormal pap 2013 without post-pregnancy colposcopy  Objective:  BP 124/83  Ht 5\' 9"  (1.753 m)  Wt 197 lb (89.359 kg)  BMI 29.08 kg/m2  Breastfeeding? No   BMI: Body mass index is 29.08 kg/(m^2).  General Appearance: Alert, appropriate appearance for age. No acute distress HEENT: Grossly normal Neck / Thyroid:  Cardiovascular: RRR; normal S1, S2, no murmur Lungs: CTA bilaterally Back: No CVAT Breast Exam: Normal to inspection, Normal breast tissue bilaterally, bilateral fibrocystic changes and No masses or nodes.No dimpling, nipple retraction or discharge. Gastrointestinal: Soft, non-tender, no masses or organomegaly Pelvic Exam: Vulva and vagina appear normal. Bimanual exam reveals normal uterus and adnexa. External genitalia: normal general  appearance Cervix: normal appearance Adnexa: normal bimanual exam Uterus: anteverted Rectovaginal: not indicated Lymphatic Exam: Non-palpable nodes in neck, clavicular, axillary, or inguinal regions Skin: no rash or abnormalities Neurologic: Normal gait and speech, no tremor  Psychiatric: Alert and oriented, appropriate affect.  Urinalysis:Not done  [name]. MD Pgr [phone] 12:19 PM

## 2013-07-01 ENCOUNTER — Telehealth: Payer: Self-pay | Admitting: Obstetrics and Gynecology

## 2013-07-01 NOTE — Telephone Encounter (Signed)
Pt informed pap from 06/26/2013 still pending.

## 2013-07-02 ENCOUNTER — Ambulatory Visit: Payer: Medicaid Other | Admitting: Gastroenterology

## 2013-07-02 ENCOUNTER — Telehealth: Payer: Self-pay | Admitting: Gastroenterology

## 2013-07-02 NOTE — Telephone Encounter (Signed)
Pt was a no show

## 2013-07-02 NOTE — Telephone Encounter (Signed)
Please send a letter for f/u.

## 2013-07-03 ENCOUNTER — Telehealth: Payer: Self-pay | Admitting: Obstetrics and Gynecology

## 2013-07-03 ENCOUNTER — Encounter: Payer: Self-pay | Admitting: Obstetrics and Gynecology

## 2013-07-03 ENCOUNTER — Telehealth: Payer: Self-pay | Admitting: *Deleted

## 2013-07-03 ENCOUNTER — Encounter: Payer: Self-pay | Admitting: Gastroenterology

## 2013-07-03 DIAGNOSIS — R87619 Unspecified abnormal cytological findings in specimens from cervix uteri: Secondary | ICD-10-CM

## 2013-07-03 NOTE — Progress Notes (Signed)
Already has colpo appt.

## 2013-07-03 NOTE — Telephone Encounter (Signed)
Message copied by Farley Ly on Wed Jul 03, 2013 11:51 AM ------      Message from: Jonnie Kind      Created: Wed Jul 03, 2013  9:36 AM       High Grade abnormalities, will need Colposcopy and subsequent treatment. ------

## 2013-07-03 NOTE — Telephone Encounter (Signed)
Pt aware of results and appointment already made for colposcopy.

## 2013-07-03 NOTE — Telephone Encounter (Signed)
[employer] number "not available", will ask staff to call.

## 2013-07-03 NOTE — Telephone Encounter (Signed)
Mailed letter °

## 2013-07-25 ENCOUNTER — Ambulatory Visit (INDEPENDENT_AMBULATORY_CARE_PROVIDER_SITE_OTHER): Payer: Medicaid Other | Admitting: Obstetrics and Gynecology

## 2013-07-25 ENCOUNTER — Encounter: Payer: Self-pay | Admitting: Obstetrics and Gynecology

## 2013-07-25 VITALS — BP 132/72 | Ht 69.0 in | Wt 188.6 lb

## 2013-07-25 DIAGNOSIS — D069 Carcinoma in situ of cervix, unspecified: Secondary | ICD-10-CM

## 2013-07-25 DIAGNOSIS — Z3202 Encounter for pregnancy test, result negative: Secondary | ICD-10-CM

## 2013-07-25 LAB — POCT URINE PREGNANCY: PREG TEST UR: NEGATIVE

## 2013-07-25 NOTE — Progress Notes (Signed)
This chart was transcribed for Dr. Mallory Shirk by Ludger Nutting, ED scribe.    Christina Lowe 28 y.o. 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 Pap smear on 06/26/13.  Discussed role for HPV in cervical dysplasia, need for surveillance.  Patient given informed consent, signed copy in the chart, time out was performed.  Placed in lithotomy position. Cervix viewed with speculum and colposcope after application of acetic acid.   Colposcopy adequate? Yes Small rim of suspected moderate dysplasia all the way around the cervix. Biopsies obtained at 7 o'clock and 1 o'clock of cervix.   ECC specimen not obtained.  All specimens were labelled and sent to pathology.  Colposcopy IMPRESSION:  Patient was given post procedure instructions. Will follow up pathology and manage accordingly.  Routine preventative health maintenance measures emphasized.

## 2013-07-25 NOTE — Patient Instructions (Signed)
followup 2 weeks to discuss results.

## 2013-07-29 ENCOUNTER — Other Ambulatory Visit: Payer: Self-pay | Admitting: Obstetrics and Gynecology

## 2013-07-31 ENCOUNTER — Telehealth: Payer: Self-pay

## 2013-07-31 NOTE — Telephone Encounter (Signed)
Spoke with pt. Biopsy results not in yet. Advised it usually takes about 7 business days to get results. Pt voiced understanding. [place]

## 2013-08-09 ENCOUNTER — Ambulatory Visit: Payer: Medicaid Other | Admitting: Obstetrics and Gynecology

## 2013-08-09 ENCOUNTER — Telehealth: Payer: Self-pay | Admitting: *Deleted

## 2013-08-09 NOTE — Telephone Encounter (Signed)
Discussed need for conization of cervix. Pt will make appt next week to plan surgery. Appt made

## 2013-08-09 NOTE — Telephone Encounter (Signed)
Pt states unable to keep her appt today at 11:45 due to transportation issue, requesting Dr. Glo Herring to call her to discuss biopsy results. Offered to reschedule appt but pt states does not know when she will be able to have transportation.

## 2013-08-19 ENCOUNTER — Encounter: Payer: Self-pay | Admitting: Obstetrics and Gynecology

## 2013-08-19 ENCOUNTER — Ambulatory Visit (INDEPENDENT_AMBULATORY_CARE_PROVIDER_SITE_OTHER): Payer: Medicaid Other | Admitting: Obstetrics and Gynecology

## 2013-08-19 VITALS — BP 120/70 | Ht 69.0 in | Wt 190.0 lb

## 2013-08-19 DIAGNOSIS — R87619 Unspecified abnormal cytological findings in specimens from cervix uteri: Secondary | ICD-10-CM

## 2013-08-19 DIAGNOSIS — R1031 Right lower quadrant pain: Secondary | ICD-10-CM

## 2013-08-19 DIAGNOSIS — D069 Carcinoma in situ of cervix, unspecified: Secondary | ICD-10-CM

## 2013-08-19 MED ORDER — OXYCODONE-ACETAMINOPHEN 5-325 MG PO TABS: 1.0000 | ORAL_TABLET | ORAL | Status: DC | PRN

## 2013-08-19 NOTE — Progress Notes (Signed)
This chart was scribed by Ludger Nutting, Medical Scribe, for Dr. Mallory Shirk on 08/19/13 at 3:41 PM. This chart was reviewed by Dr. Mallory Shirk and is accurate.   [facility] Visit  Patient name: Christina Lowe MRN [id number]  Date of birth: 06/13/1986  CC & HPI:  Beonka Amesquita is a 28 y.o. female presenting today to discuss conization of cervix. Pt was seen on 07/25/13 for a colposcopy. 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  Does not have regular periods due to Depo injections.   Pt states she may have had her "bowel nipped" during her cesarean by an OBGYN in [place]. ;will request records   ROS:  Chronic RLQ abdominal pain which is worse with stress   Pertinent History Reviewed:  Medical & Surgical Hx:  Reviewed: Significant for  Chronic rlq pain.  Past Medical History  Diagnosis Date  . Anxiety     panic disorder  . Depression     has history- ok now  . Asthma     no inhaler  . Vaginal Pap smear, abnormal     Past Surgical History  Procedure Laterality Date  . Cesarean section    . Appendectomy    . Diagnostic laparoscopy    . Cesarean section  06/16/2012    Procedure: CESAREAN SECTION;  Surgeon: Jonnie Kind, MD;  Location: [facility];  Service: Obstetrics;  Laterality: N/A;  . Colonoscopy  10/07/2010    HDQ:QIWLN distal rectal polyp, status post snare polypectomy and ablation, otherwise normal-appearing anal canal, rectum, colon/I doubt significant GI bleeding    Medications: Reviewed & Updated - see associated section Social History: Reviewed -  reports that she has been smoking Cigarettes.  She has a 12 pack-year smoking history. She has never used smokeless tobacco.  Objective Findings:  Vitals: BP 120/70   Ht 5\' 9"  (1.753 m)  Wt 190 lb (86.183 kg)  BMI 28.05 kg/m2  Physical Examination: General appearance - alert, well appearing, and in no distress and oriented to person, place, and time Pelvic - examination not indicated   Assessment & Plan:   A: 1. Chronic rlq pain 2. 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 Obligations for family will require delaying surgery  P: 1. Conization in 2 weeks  2. Return for pre op on early Mar.

## 2013-09-02 ENCOUNTER — Ambulatory Visit (INDEPENDENT_AMBULATORY_CARE_PROVIDER_SITE_OTHER): Payer: Medicaid Other | Admitting: Obstetrics and Gynecology

## 2013-09-02 ENCOUNTER — Encounter: Payer: Self-pay | Admitting: Obstetrics and Gynecology

## 2013-09-02 VITALS — BP 136/78 | Ht 69.0 in | Wt 185.6 lb

## 2013-09-02 DIAGNOSIS — D069 Carcinoma in situ of cervix, unspecified: Secondary | ICD-10-CM

## 2013-09-02 MED ORDER — OXYCODONE-ACETAMINOPHEN 5-325 MG PO TABS: 1.0000 | ORAL_TABLET | ORAL | Status: DC | PRN

## 2013-09-02 NOTE — Progress Notes (Signed)
This chart was scribed by Ludger Nutting, Medical Scribe, for Dr. Mallory Shirk on 09/02/13 at 2:57 PM. This chart was reviewed by Dr. Mallory Shirk and is accurate.  [facility] Visit  Patient name: Christina Lowe MRN [id number]  Date of birth: June 22, 1986  CC & HPI:  [name] is a 28 y.o. female presenting today for continued RLQ abdominal pain for many years. Pt is also to have a cervix conization for high grade abnormalities. 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  March 2006 had a ovarian cyst removed.  Pt is declining to have surgery to discover the cause of the RLQ pain.  Pt is declining trigger point injection at this time.  ROS:  Negative except noted above.   Pertinent History Reviewed:  Medical & Surgical Hx:  Reviewed: Significant for  Past Medical History  Diagnosis Date  . Anxiety     panic disorder  . Depression     has history- ok now  . Asthma     no inhaler  . Vaginal Pap smear, abnormal     Past Surgical History  Procedure Laterality Date  . Cesarean section    . Appendectomy    . Diagnostic laparoscopy    . Cesarean section  06/16/2012    Procedure: CESAREAN SECTION;  Surgeon: Jonnie Kind, MD;  Location: [facility];  Service: Obstetrics;  Laterality: N/A;  . Colonoscopy  10/07/2010    VCB:SWHQP distal rectal polyp, status post snare polypectomy and ablation, otherwise normal-appearing anal canal, rectum, colon/I doubt significant GI bleeding    Medications: Reviewed & Updated - see associated section Social History: Reviewed -  reports that she has been smoking Cigarettes.  She has a 12 pack-year smoking history. She has never used smokeless tobacco.  Objective Findings:  Vitals: BP 136/78  Ht 5\' 9"  (1.753 m)  Wt 185 lb 9.6 oz (84.188 kg)  BMI 27.40 kg/m2  Physical Examination: General appearance - alert, well appearing, and in no distress  and oriented to person, place, and time Pelvic - Not indicated at this time.    Assessment & Plan:   A: 1. High Grade lesion CINIII  2. Anxiety  3. Bipolar disorder 4. Chronic Rlq pain, ?etiology  P: 1. Cervical conization within next 3 weeks.  2. Refill  Percocet 5/325  3.

## 2013-09-02 NOTE — Patient Instructions (Signed)
Since you do not feel the right sided pain is likely to be benefitted by laparoscopy, we willl proceed with the conization of the cervix within 3 weeks.

## 2013-09-06 ENCOUNTER — Encounter (HOSPITAL_COMMUNITY): Payer: Self-pay | Admitting: Pharmacy Technician

## 2013-09-10 ENCOUNTER — Other Ambulatory Visit: Payer: Self-pay | Admitting: Obstetrics and Gynecology

## 2013-09-10 NOTE — Patient Instructions (Signed)
Your procedure is scheduled on: 09/17/13  Report to [facility] at 11:15 AM  Call this number if you have problems the morning of surgery: [phone]   Remember:   Do not eat food or drink liquids after midnight.   Take these medicines the morning of surgery with A SIP OF WATER: Clonazepam and Vyvanse. You may take your Percocet if needed.   Do not wear jewelry, make-up or nail polish.  Do not wear lotions, powders, or perfumes.   Do not shave 48 hours prior to surgery. Men may shave face and neck.  Do not bring valuables to the hospital.  [facility] is not responsible for any belongings or valuables.               Contacts, dentures or bridgework may not be worn into surgery.  Leave suitcase in the car. After surgery it may be brought to your room.  For patients admitted to the hospital, discharge time is determined by your treatment team.               Patients discharged the day of surgery will not be allowed to drive home.    Special Instructions: Shower using CHG 1 night before surgery and the morning before surgery.  Use special wash - you have one bottle of CHG for both showers.  You should use approximately 1/2 of the bottle for each shower.   Please read over the following fact sheets that you were given: Anesthesia Post-op Instructions and Care and Recovery After Surgery     Conization of the Cervix Cervical conization is the cutting (excision) of a cone-shaped portion of the cervix. The procedure is performed through the vagina in either your health care provider's office or an operating room. This procedure is usually done when there is abnormal bleeding from the cervix. It can also be done to evaluate an abnormal Pap test or if an abnormality is seen on the cervix during an exam. The tissue is then examined to see if there are precancerous cells or cancer present.  Conization of the cervix is not done during a menstrual period or pregnancy.  LET [facility] CARE PROVIDER  KNOW ABOUT:  Any allergies you have.   All medicines you are taking, including vitamins, herbs, eye drops, creams, and over-the-counter medicines.   Previous problems you or members of your family have had with the use of anesthetics.   Any blood disorders you have.   Previous surgeries you have had.   Medical conditions you have.   Your smoking habits.   The possibility of being pregnant.  RISKS AND COMPLICATIONS  Generally, conization of the cervix is a safe procedure. However, as with any procedure, complications can occur. Possible complications include:  Heavy bleeding several days or weeks after the procedure. Light bleeding or spotting after the procedure is normal.  Infection (rare).  Damage to the cervix or surrounding organs (uncommon).   Problems with the anesthesia.   Increased risk of preterm labor in future pregnancies. BEFORE THE PROCEDURE  Do not eat or drink anything for 6 8 hours before the procedure.   Do not take aspirin or blood thinners for at least a week before the procedure or as directed by your health care provider.   Arrange for someone to take you home after the procedure.  PROCEDURE There are three different methods to perform conization of the cervix. These include:   The cold knife method In this method a small cone-shaped  sample of tissue is cut out with a knife (scalpel) from the cervical canal and the transformation zone (where the normal cells end and the abnormal cells begin).   The LEEP method In this method a small cone-shaped sample of tissue is cut out with a thin wire that can burn (cauterize) the cervical tissue with an electrical current.   Laser treatment In this method a small cone-shaped sample of tissue is cut out and then cauterized with a laser beam to prevent bleeding.  The procedure will be performed as follows:   Depending on the method, you will either be given a medicine to make you sleep (general  anesthetic) or a numbing medicine (local anesthetic). A medicine that numbs the cervix (cervical block) may be given.   A lubricated device called a speculum will be inserted into the vagina to spread open the walls of the vagina. This will help your health care provider see the inside of the vagina and cervix better.   The tissue from the cervix will be removed and examined.   The results of the procedure will help your health care provider decide if further treatment is necessary. They will also help your health care provider decide on the best treatment if your results are abnormal. AFTER THE PROCEDURE  If you had a general anesthetic, you may be groggy for 2 3 hours after the procedure.   If you had a local anesthetic, you will rest at the clinic or hospital until you are stable and feel ready to go home.   Recovery may take up to 3 weeks.   You may have some cramping for about 1 week.   You may have bloody discharge or light bleeding for 1 2 weeks.   You may have black discharge coming from the vagina. This is from the paste used on the cervix to prevent bleeding. This is normal discharge.  Document Released: 03/23/2005 Document Revised: 02/13/2013 Document Reviewed: 12/07/2012 [facility] Patient Information 2014 [employer].    PATIENT INSTRUCTIONS POST-ANESTHESIA  IMMEDIATELY FOLLOWING SURGERY:  Do not drive or operate machinery for the first twenty four hours after surgery.  Do not make any important decisions for twenty four hours after surgery or while taking narcotic pain medications or sedatives.  If you develop intractable nausea and vomiting or a severe headache please notify your doctor immediately.  FOLLOW-UP:  Please make an appointment with your surgeon as instructed. You do not need to follow up with anesthesia unless specifically instructed to do so.  WOUND CARE INSTRUCTIONS (if applicable):  Keep a dry clean dressing on the anesthesia/puncture wound  site if there is drainage.  Once the wound has quit draining you may leave it open to air.  Generally you should leave the bandage intact for twenty four hours unless there is drainage.  If the epidural site drains for more than 36-48 hours please call the anesthesia department.  QUESTIONS?:  Please feel free to call your physician or the hospital operator if you have any questions, and they will be happy to assist you.

## 2013-09-11 ENCOUNTER — Encounter (HOSPITAL_COMMUNITY)
Admission: RE | Admit: 2013-09-11 | Discharge: 2013-09-11 | Disposition: A | Discharge status: Home / Self Care | Payer: Medicaid Other | Source: Ambulatory Visit | Attending: Obstetrics and Gynecology | Admitting: Obstetrics and Gynecology

## 2013-09-11 ENCOUNTER — Encounter (HOSPITAL_COMMUNITY): Payer: Self-pay

## 2013-09-11 DIAGNOSIS — Z01812 Encounter for preprocedural laboratory examination: Secondary | ICD-10-CM | POA: Insufficient documentation

## 2013-09-11 HISTORY — DX: Attention-deficit hyperactivity disorder, unspecified type: F90.9

## 2013-09-11 HISTORY — DX: Obsessive-compulsive disorder, unspecified: F42.9

## 2013-09-11 LAB — BASIC METABOLIC PANEL
BUN: 5 mg/dL — ABNORMAL LOW (ref 6–23)
CALCIUM: 9.6 mg/dL (ref 8.4–10.5)
CHLORIDE: 104 meq/L (ref 96–112)
CO2: 25 meq/L (ref 19–32)
CREATININE: 0.83 mg/dL (ref 0.50–1.10)
GFR calc Af Amer: >90 mL/min (ref >90)
GFR calc non Af Amer: >90 mL/min (ref >90)
GLUCOSE: 109 mg/dL — AB (ref 70–99)
Potassium: 4.3 mEq/L (ref 3.7–5.3)
Sodium: 142 mEq/L (ref 137–147)

## 2013-09-11 LAB — SURGICAL PCR SCREEN
MRSA, PCR: NEGATIVE
STAPHYLOCOCCUS AUREUS: POSITIVE — AB

## 2013-09-11 LAB — URINALYSIS, ROUTINE W REFLEX MICROSCOPIC
BILIRUBIN URINE: NEGATIVE
GLUCOSE, UA: NEGATIVE mg/dL
HGB URINE DIPSTICK: NEGATIVE
Ketones, ur: NEGATIVE mg/dL
Leukocytes, UA: NEGATIVE
Nitrite: NEGATIVE
Protein, ur: NEGATIVE mg/dL
SPECIFIC GRAVITY, URINE: 1.015 (ref 1.005–1.030)
Urobilinogen, UA: 0.2 mg/dL (ref 0.0–1.0)
pH: 7 (ref 5.0–8.0)

## 2013-09-11 LAB — CBC
HEMATOCRIT: 37 % (ref 36.0–46.0)
Hemoglobin: 12.4 g/dL (ref 12.0–15.0)
MCH: 30.8 pg (ref 26.0–34.0)
MCHC: 33.5 g/dL (ref 30.0–36.0)
MCV: 91.8 fL (ref 78.0–100.0)
PLATELETS: 311 10*3/uL (ref 150–400)
RBC: 4.03 MIL/uL (ref 3.87–5.11)
RDW: 14.2 % (ref 11.5–15.5)
WBC: 7.9 10*3/uL (ref 4.0–10.5)

## 2013-09-11 LAB — HCG, SERUM, QUALITATIVE: Preg, Serum: NEGATIVE

## 2013-09-12 ENCOUNTER — Other Ambulatory Visit: Payer: Self-pay | Admitting: Obstetrics and Gynecology

## 2013-09-12 ENCOUNTER — Telehealth: Payer: Self-pay

## 2013-09-12 MED ORDER — MUPIROCIN 2 % EX OINT: 1.0000 "application " | TOPICAL_OINTMENT | Freq: Two times a day (BID) | CUTANEOUS | Status: DC

## 2013-09-12 NOTE — Telephone Encounter (Signed)
Error/09/12/13 sn

## 2013-09-17 ENCOUNTER — Encounter (HOSPITAL_COMMUNITY): Payer: Self-pay | Admitting: *Deleted

## 2013-09-17 ENCOUNTER — Ambulatory Visit (HOSPITAL_COMMUNITY): Payer: Medicaid Other | Admitting: Anesthesiology

## 2013-09-17 ENCOUNTER — Telehealth: Payer: Self-pay | Admitting: *Deleted

## 2013-09-17 ENCOUNTER — Encounter (HOSPITAL_COMMUNITY): Payer: Medicaid Other | Admitting: Anesthesiology

## 2013-09-17 ENCOUNTER — Encounter (HOSPITAL_COMMUNITY)
Admission: RE | Disposition: A | Discharge status: Home / Self Care | Payer: Self-pay | Source: Ambulatory Visit | Attending: Obstetrics and Gynecology

## 2013-09-17 ENCOUNTER — Ambulatory Visit (HOSPITAL_COMMUNITY)
Admission: RE | Admit: 2013-09-17 | Discharge: 2013-09-17 | Disposition: A | Discharge status: Home / Self Care | Payer: Medicaid Other | Source: Ambulatory Visit | Attending: Obstetrics and Gynecology | Admitting: Obstetrics and Gynecology

## 2013-09-17 DIAGNOSIS — R1031 Right lower quadrant pain: Secondary | ICD-10-CM

## 2013-09-17 DIAGNOSIS — F17200 Nicotine dependence, unspecified, uncomplicated: Secondary | ICD-10-CM | POA: Insufficient documentation

## 2013-09-17 DIAGNOSIS — F411 Generalized anxiety disorder: Secondary | ICD-10-CM | POA: Insufficient documentation

## 2013-09-17 DIAGNOSIS — R87619 Unspecified abnormal cytological findings in specimens from cervix uteri: Secondary | ICD-10-CM

## 2013-09-17 DIAGNOSIS — D069 Carcinoma in situ of cervix, unspecified: Secondary | ICD-10-CM | POA: Insufficient documentation

## 2013-09-17 HISTORY — PX: CERVICAL CONIZATION W/BX: SHX1330

## 2013-09-17 SURGERY — CONE BIOPSY, CERVIX
Anesthesia: General | Site: Cervix

## 2013-09-17 MED ORDER — GLYCOPYRROLATE 0.2 MG/ML IJ SOLN
0.2000 mg | Freq: Once | INTRAMUSCULAR | Status: AC
  Administered 2013-09-17: 0.2 mg via INTRAVENOUS
  Filled 2013-09-17: qty 1

## 2013-09-17 MED ORDER — FENTANYL CITRATE 0.05 MG/ML IJ SOLN
25.0000 ug | INTRAMUSCULAR | Status: DC | PRN
  Administered 2013-09-17 (×2): 50 ug via INTRAVENOUS
  Filled 2013-09-17: qty 2

## 2013-09-17 MED ORDER — ONDANSETRON HCL 4 MG/2ML IJ SOLN: 4.0000 mg | Freq: Once | INTRAMUSCULAR | Status: DC | PRN

## 2013-09-17 MED ORDER — LACTATED RINGERS IV SOLN
INTRAVENOUS | Status: DC
  Administered 2013-09-17: 1000 mL via INTRAVENOUS

## 2013-09-17 MED ORDER — KETOROLAC TROMETHAMINE 10 MG PO TABS: 10.0000 mg | ORAL_TABLET | Freq: Four times a day (QID) | ORAL | Status: DC | PRN

## 2013-09-17 MED ORDER — FENTANYL CITRATE 0.05 MG/ML IJ SOLN
25.0000 ug | INTRAMUSCULAR | Status: AC
  Administered 2013-09-17 (×2): 25 ug via INTRAVENOUS

## 2013-09-17 MED ORDER — FENTANYL CITRATE 0.05 MG/ML IJ SOLN
INTRAMUSCULAR | Status: AC
  Filled 2013-09-17: qty 5

## 2013-09-17 MED ORDER — MIDAZOLAM HCL 2 MG/2ML IJ SOLN
1.0000 mg | INTRAMUSCULAR | Status: AC | PRN
  Administered 2013-09-17 (×3): 2 mg via INTRAVENOUS
  Filled 2013-09-17 (×2): qty 2

## 2013-09-17 MED ORDER — FERRIC SUBSULFATE 259 MG/GM EX SOLN
CUTANEOUS | Status: DC | PRN
  Administered 2013-09-17: 1

## 2013-09-17 MED ORDER — 0.9 % SODIUM CHLORIDE (POUR BTL) OPTIME
TOPICAL | Status: DC | PRN
  Administered 2013-09-17: 1000 mL

## 2013-09-17 MED ORDER — BUPIVACAINE-EPINEPHRINE PF 0.5-1:200000 % IJ SOLN
INTRAMUSCULAR | Status: AC
  Filled 2013-09-17: qty 10

## 2013-09-17 MED ORDER — LACTATED RINGERS IV SOLN
INTRAVENOUS | Status: DC | PRN
  Administered 2013-09-17: 12:00:00 via INTRAVENOUS

## 2013-09-17 MED ORDER — LIDOCAINE HCL (CARDIAC) 20 MG/ML IV SOLN
INTRAVENOUS | Status: DC | PRN
  Administered 2013-09-17: 50 mg via INTRAVENOUS

## 2013-09-17 MED ORDER — PROPOFOL 10 MG/ML IV EMUL
INTRAVENOUS | Status: AC
  Filled 2013-09-17: qty 20

## 2013-09-17 MED ORDER — MIDAZOLAM HCL 2 MG/2ML IJ SOLN
INTRAMUSCULAR | Status: AC
  Filled 2013-09-17: qty 2

## 2013-09-17 MED ORDER — ONDANSETRON HCL 4 MG/2ML IJ SOLN
4.0000 mg | Freq: Once | INTRAMUSCULAR | Status: AC
Start: 2013-09-17 — End: 2013-09-17
  Administered 2013-09-17: 4 mg via INTRAVENOUS
  Filled 2013-09-17: qty 2

## 2013-09-17 MED ORDER — FENTANYL CITRATE 0.05 MG/ML IJ SOLN
INTRAMUSCULAR | Status: AC
  Filled 2013-09-17: qty 2

## 2013-09-17 MED ORDER — FENTANYL CITRATE 0.05 MG/ML IJ SOLN
INTRAMUSCULAR | Status: DC | PRN
  Administered 2013-09-17 (×2): 50 ug via INTRAVENOUS

## 2013-09-17 MED ORDER — BUPIVACAINE-EPINEPHRINE 0.5% -1:200000 IJ SOLN
INTRAMUSCULAR | Status: DC | PRN
  Administered 2013-09-17: 8 mL

## 2013-09-17 MED ORDER — FERRIC SUBSULFATE 259 MG/GM EX SOLN
CUTANEOUS | Status: AC
  Filled 2013-09-17: qty 8

## 2013-09-17 MED ORDER — OXYCODONE-ACETAMINOPHEN 5-325 MG PO TABS: 1.0000 | ORAL_TABLET | Freq: Four times a day (QID) | ORAL | Status: DC | PRN

## 2013-09-17 MED ORDER — PROPOFOL 10 MG/ML IV BOLUS
INTRAVENOUS | Status: DC | PRN
  Administered 2013-09-17: 200 mg via INTRAVENOUS

## 2013-09-17 SURGICAL SUPPLY — 28 items
BAG HAMPER (MISCELLANEOUS) ×3 IMPLANT
BLADE SURG SZ11 CARB STEEL (BLADE) ×3 IMPLANT
CATH ROBINSON RED A/P 16FR (CATHETERS) IMPLANT
CLOTH BEACON ORANGE TIMEOUT ST (SAFETY) ×3 IMPLANT
COVER LIGHT HANDLE STERIS (MISCELLANEOUS) ×6 IMPLANT
DRAPE PROXIMA HALF (DRAPES) ×3 IMPLANT
ELECT REM PT RETURN 9FT ADLT (ELECTROSURGICAL) ×3
ELECTRODE REM PT RTRN 9FT ADLT (ELECTROSURGICAL) ×1 IMPLANT
FORMALIN 10 PREFIL 120ML (MISCELLANEOUS) ×3 IMPLANT
GLOVE BIOGEL PI IND STRL 7.0 (GLOVE) ×1 IMPLANT
GLOVE BIOGEL PI IND STRL 8.5 (GLOVE) ×1 IMPLANT
GLOVE BIOGEL PI INDICATOR 7.0 (GLOVE) ×2
GLOVE BIOGEL PI INDICATOR 8.5 (GLOVE) ×2
GLOVE ECLIPSE 8.0 STRL XLNG CF (GLOVE) ×3 IMPLANT
GLOVE ECLIPSE 9.0 STRL (GLOVE) ×3 IMPLANT
GLOVE EXAM NITRILE MD LF STRL (GLOVE) ×3 IMPLANT
GLOVE INDICATOR STER SZ 9 (GLOVE) ×3 IMPLANT
GLOVE OPTIFIT SS 6.5 STRL BRWN (GLOVE) ×3 IMPLANT
GOWN SPEC L3 XXLG W/TWL (GOWN DISPOSABLE) ×3 IMPLANT
GOWN STRL REUS W/TWL LRG LVL3 (GOWN DISPOSABLE) ×6 IMPLANT
KIT ROOM TURNOVER AP CYSTO (KITS) ×3 IMPLANT
MANIFOLD NEPTUNE II (INSTRUMENTS) ×3 IMPLANT
PACK PERI GYN (CUSTOM PROCEDURE TRAY) ×3 IMPLANT
PAD ARMBOARD 7.5X6 YLW CONV (MISCELLANEOUS) ×3 IMPLANT
SET BASIN LINEN APH (SET/KITS/TRAYS/PACK) ×3 IMPLANT
SUT CHROMIC 2 0 CT 1 (SUTURE) ×3 IMPLANT
SYR CONTROL 10ML LL (SYRINGE) ×3 IMPLANT
TOWEL OR 17X26 4PK STRL BLUE (TOWEL DISPOSABLE) ×3 IMPLANT

## 2013-09-17 NOTE — Brief Op Note (Signed)
09/17/2013  1:21 PM  PATIENT:  Christina Lowe  28 y.o. female  PRE-OPERATIVE DIAGNOSIS:  high grade lesion on pap  POST-OPERATIVE DIAGNOSIS:  cervical dysplasia high grade  PROCEDURE:  Procedure(s): CONIZATION CERVIX (N/A)  SURGEON:  Surgeon(s) and Role:    * Jonnie Kind, MD - Primary  PHYSICIAN ASSISTANT:   ASSISTANTS: none   ANESTHESIA:   local and general  EBL:  Total I/O In: 800 [I.V.:800] Out: -   BLOOD ADMINISTERED:none  DRAINS: none   LOCAL MEDICATIONS USED:  MARCAINE    and Amount: 10 ml  SPECIMEN:  Source of Specimen:  cervix cone specimen  DISPOSITION OF SPECIMEN:  PATHOLOGY  COUNTS:  YES  TOURNIQUET:  * No tourniquets in log *  DICTATION: .Dragon Dictation  PLAN OF CARE: Discharge to home after PACU  PATIENT DISPOSITION:  PACU - hemodynamically stable.   Delay start of Pharmacological VTE agent (>24hrs) due to surgical blood loss or risk of bleeding: not applicable

## 2013-09-17 NOTE — Preoperative (Signed)
Beta Blockers   Reason not to administer Beta Blockers:Not Applicable 

## 2013-09-17 NOTE — Anesthesia Postprocedure Evaluation (Signed)
  Anesthesia Post-op Note  Patient: Christina Lowe  Procedure(s) Performed: Procedure(s): CONIZATION CERVIX (N/A)  Patient Location: PACU  Anesthesia Type:General  Level of Consciousness: awake, alert , oriented and patient cooperative  Airway and Oxygen Therapy: Patient Spontanous Breathing  Post-op Pain: mild  Post-op Assessment: Post-op Vital signs reviewed, Patient's Cardiovascular Status Stable, Respiratory Function Stable, Patent Airway, No signs of Nausea or vomiting, Adequate PO intake, Pain level controlled, No headache, No backache, No residual numbness and No residual motor weakness  Post-op Vital Signs: Reviewed and stable  Complications: No apparent anesthesia complications

## 2013-09-17 NOTE — Telephone Encounter (Signed)
Pt states had colonization 09/17/2013, percocet 5/325 mg not helping. Pt states was not helping prior to surgery. Pt requesting a different pain medication, states discussed this with Dr. Glo Herring prior to surgery.

## 2013-09-17 NOTE — Discharge Instructions (Signed)
Conization of the Cervix Cervical conization is the cutting (excision) of a cone-shaped portion of the cervix. The procedure is performed through the vagina in either your health care provider's office or an operating room. This procedure is usually done when there is abnormal bleeding from the cervix. It can also be done to evaluate an abnormal Pap test or if an abnormality is seen on the cervix during an exam. The tissue is then examined to see if there are precancerous cells or cancer present.  Conization of the cervix is not done during a menstrual period or pregnancy.  LET YOUR HEALTH CARE PROVIDER KNOW ABOUT:  Any allergies you have.   All medicines you are taking, including vitamins, herbs, eye drops, creams, and over-the-counter medicines.   Previous problems you or members of your family have had with the use of anesthetics.   Any blood disorders you have.   Previous surgeries you have had.   Medical conditions you have.   Your smoking habits.   The possibility of being pregnant.  RISKS AND COMPLICATIONS  Generally, conization of the cervix is a safe procedure. However, as with any procedure, complications can occur. Possible complications include:  Heavy bleeding several days or weeks after the procedure. Light bleeding or spotting after the procedure is normal.  Infection (rare).  Damage to the cervix or surrounding organs (uncommon).   Problems with the anesthesia.   Increased risk of preterm labor in future pregnancies. BEFORE THE PROCEDURE  Do not eat or drink anything for 6 8 hours before the procedure.   Do not take aspirin or blood thinners for at least a week before the procedure or as directed by your health care provider.   Arrange for someone to take you home after the procedure.  PROCEDURE There are three different methods to perform conization of the cervix. These include:   The cold knife method In this method a small cone-shaped sample  of tissue is cut out with a knife (scalpel) from the cervical canal and the transformation zone (where the normal cells end and the abnormal cells begin).   The LEEP method In this method a small cone-shaped sample of tissue is cut out with a thin wire that can burn (cauterize) the cervical tissue with an electrical current.   Laser treatment In this method a small cone-shaped sample of tissue is cut out and then cauterized with a laser beam to prevent bleeding.  The procedure will be performed as follows:   Depending on the method, you will either be given a medicine to make you sleep (general anesthetic) or a numbing medicine (local anesthetic). A medicine that numbs the cervix (cervical block) may be given.   A lubricated device called a speculum will be inserted into the vagina to spread open the walls of the vagina. This will help your health care provider see the inside of the vagina and cervix better.   The tissue from the cervix will be removed and examined.   The results of the procedure will help your health care provider decide if further treatment is necessary. They will also help your health care provider decide on the best treatment if your results are abnormal. AFTER THE PROCEDURE  If you had a general anesthetic, you may be groggy for 2 3 hours after the procedure.   If you had a local anesthetic, you will rest at the clinic or hospital until you are stable and feel ready to go home.   Recovery may   take up to 3 weeks.   You may have some cramping for about 1 week.   You may have bloody discharge or light bleeding for 1 2 weeks.   You may have black discharge coming from the vagina. This is from the paste used on the cervix to prevent bleeding. This is normal discharge.  Document Released: 03/23/2005 Document Revised: 02/13/2013 Document Reviewed: 12/07/2012 [employer] Patient Information 2014 [employer].  

## 2013-09-17 NOTE — Anesthesia Preprocedure Evaluation (Addendum)
Anesthesia Evaluation  Patient identified by MRN, date of birth, ID band Patient awake    Reviewed: Allergy & Precautions, H&P , NPO status , Patient's Chart, lab work & pertinent test results  Airway Mallampati: I TM Distance: >3 FB Neck ROM: Full    Dental  (+) Teeth Intact   Pulmonary asthma , Current Smoker,  breath sounds clear to auscultation        Cardiovascular negative cardio ROS  Rhythm:Regular Rate:Normal     Neuro/Psych PSYCHIATRIC DISORDERS (ADHD) Anxiety Depression    GI/Hepatic   Endo/Other    Renal/GU      Musculoskeletal   Abdominal   Peds  Hematology   Anesthesia Other Findings   Reproductive/Obstetrics                          Anesthesia Physical Anesthesia Plan  ASA: II  Anesthesia Plan: General   Post-op Pain Management:    Induction: Intravenous  Airway Management Planned: LMA  Additional Equipment:   Intra-op Plan:   Post-operative Plan: Extubation in OR  Informed Consent: I have reviewed the patients History and Physical, chart, labs and discussed the procedure including the risks, benefits and alternatives for the proposed anesthesia with the patient or authorized representative who has indicated his/her understanding and acceptance.     Plan Discussed with:   Anesthesia Plan Comments:         Anesthesia Quick Evaluation

## 2013-09-17 NOTE — H&P (Signed)
  [facility] Visit   Patient name: Christina Lowe MRN [id number] Date of birth: 02-Sep-1985  CC & HPI:   Antonya Leeder is a 28 y.o. female presenting today for continued RLQ abdominal pain for many years. Pt is also to have a cervix conization for high grade abnormalities. 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  March 2006 had a ovarian cyst removed.  Pt is declining to have surgery to discover the cause of the RLQ pain.  Pt is declining trigger point injection at this time.  ROS:   Negative except noted above.  Pertinent History Reviewed:   Medical & Surgical Hx: Reviewed: Significant for  Past Medical History   Diagnosis  Date   .  Anxiety      panic disorder   .  Depression      has history- ok now   .  Asthma      no inhaler   .  Vaginal Pap smear, abnormal     Past Surgical History   Procedure  Laterality  Date   .  Cesarean section     .  Appendectomy     .  Diagnostic laparoscopy     .  Cesarean section   06/16/2012     Procedure: CESAREAN SECTION; Surgeon: Jonnie Kind, MD; Location: [facility]; Service: Obstetrics; Laterality: N/A;   .  Colonoscopy   10/07/2010     XBL:TJQZE distal rectal polyp, status post snare polypectomy and ablation, otherwise normal-appearing anal canal, rectum, colon/I doubt significant GI bleeding   Medications: Reviewed & Updated - see associated section  Social History: Reviewed - reports that she has been smoking Cigarettes. She has a 12 pack-year smoking history. She has never used smokeless tobacco.  Objective Findings:   Vitals: BP 136/78  Ht 5\' 9"  (1.753 m)  Wt 185 lb 9.6 oz (84.188 kg)  BMI 27.40 kg/m2  Physical Examination: General appearance - alert, well appearing, and in no distress and oriented to person, place, and time  Pelvic - Not indicated at this time.  Assessment & Plan:   A:  1. High Grade lesion CINIII  2.  Anxiety  3. Bipolar disorder  4. Chronic Rlq pain, ?etiology  P:  1. Cervical conization

## 2013-09-17 NOTE — Transfer of Care (Signed)
Immediate Anesthesia Transfer of Care Note  Patient: Christina Lowe  Procedure(s) Performed: Procedure(s): CONIZATION CERVIX (N/A)  Patient Location: PACU  Anesthesia Type:General  Level of Consciousness: awake, alert  and oriented  Airway & Oxygen Therapy: Patient Spontanous Breathing and Patient connected to nasal cannula oxygen  Post-op Assessment: Report given to PACU RN and Post -op Vital signs reviewed and stable  Post vital signs: Reviewed and stable  Complications: No apparent anesthesia complications

## 2013-09-18 NOTE — Addendum Note (Signed)
Addendum created 09/18/13 1158 by Vista Deck, CRNA   Modules edited: Charges VN

## 2013-09-19 ENCOUNTER — Encounter (HOSPITAL_COMMUNITY): Payer: Self-pay | Admitting: Obstetrics and Gynecology

## 2013-09-19 NOTE — Brief Op Note (Signed)
09/17/2013  9:02 PM  PATIENT:  Christina Lowe  28 y.o. female  PRE-OPERATIVE DIAGNOSIS:  high grade lesion on pap  POST-OPERATIVE DIAGNOSIS:  cervical dysplasia high grade  PROCEDURE:  Procedure(s): CONIZATION CERVIX (N/A)  SURGEON:  Surgeon(s) and Role:    * Jonnie Kind, MD - Primary  PHYSICIAN ASSISTANT:   ASSISTANTS: none   ANESTHESIA:   general and paracervical block  EBL:  Total I/O In: 1000 [P.O.:200; I.V.:800] Out: 20 [Blood:20]  BLOOD ADMINISTERED:none  DRAINS: none   LOCAL MEDICATIONS USED:  MARCAINE     SPECIMEN:  Source of Specimen:  cervical cone specimen  DISPOSITION OF SPECIMEN:  PATHOLOGY  COUNTS:  YES  TOURNIQUET:  * No tourniquets in log *  DICTATION: .Note written in EPIC  PLAN OF CARE: Discharge to home after PACU  PATIENT DISPOSITION:  PACU - hemodynamically stable.   Delay start of Pharmacological VTE agent (>24hrs) due to surgical blood loss or risk of bleeding: not applicable

## 2013-09-19 NOTE — Op Note (Signed)
09/17/2013  9:02 PM  PATIENT:  Christina Lowe  28 y.o. female  PRE-OPERATIVE DIAGNOSIS:  high grade lesion on pap  POST-OPERATIVE DIAGNOSIS:  cervical dysplasia high grade  PROCEDURE:  Procedure(s): CONIZATION CERVIX (N/A)  SURGEON:  Surgeon(s) and Role:    * Jonnie Kind, MD - Primary  PHYSICIAN ASSISTANT:   ASSISTANTS: none   ANESTHESIA:   general and paracervical block  EBL:  Total I/O In: 1000 [P.O.:200; I.V.:800] Out: 20 [Blood:20]  BLOOD ADMINISTERED:none  Details of procedure.  Pt was taken to the OR . She was prepped, draped ,a nd time out conducted. The patient had vaginal prep, time out conducted, and speculum inserted. The cervix had stitches of 2-0 chromic placed at 3 and 9 oclock, then a 1.5 cm cone of cervical tissue cored out around the endocervical canal, x 1 cm deep in the small cervix. Anterior lip required a mattress suture for hemostasis. Monsels was applied. Paracervical Marcaine had been injected for postop pain control. Patient to recovery in good condition.

## 2013-09-24 ENCOUNTER — Encounter: Payer: Medicaid Other | Admitting: Obstetrics and Gynecology

## 2013-10-04 ENCOUNTER — Encounter: Payer: Medicaid Other | Admitting: Obstetrics and Gynecology

## 2013-10-09 ENCOUNTER — Encounter: Payer: Self-pay | Admitting: Obstetrics and Gynecology

## 2013-10-09 ENCOUNTER — Ambulatory Visit (INDEPENDENT_AMBULATORY_CARE_PROVIDER_SITE_OTHER): Payer: Self-pay | Admitting: Obstetrics and Gynecology

## 2013-10-09 VITALS — BP 140/90 | Wt 181.0 lb

## 2013-10-09 DIAGNOSIS — Z9889 Other specified postprocedural states: Secondary | ICD-10-CM | POA: Insufficient documentation

## 2013-10-09 DIAGNOSIS — R1031 Right lower quadrant pain: Secondary | ICD-10-CM

## 2013-10-09 DIAGNOSIS — F419 Anxiety disorder, unspecified: Secondary | ICD-10-CM

## 2013-10-09 DIAGNOSIS — R87619 Unspecified abnormal cytological findings in specimens from cervix uteri: Secondary | ICD-10-CM

## 2013-10-09 MED ORDER — OXYCODONE-ACETAMINOPHEN 7.5-325 MG PO TABS: 1.0000 | ORAL_TABLET | ORAL | Status: DC | PRN

## 2013-10-09 NOTE — Progress Notes (Signed)
This chart was scribed by Jenne Campus, Medical Scribe, for Dr. Mallory Shirk on 10/09/13 at 4:32 PM. This chart was reviewed by Dr. Mallory Shirk and is accurate.  Subjective:  Christina Lowe is a 27 y.o. female who presents to the clinic 3 week status post conization of the cervix.   09/17/13 Cervix, cone - HIGH GRADE SQUAMOUS INTRAEPITHELIAL LESION, CIN-III (SEVERE DYSPLASIA/CIS), INVOLVING ENDOCERVICAL GLAND, FOCALLY EXTENDING TO THE INKED ENDOCERVICAL MARGIN. - EXOCERVICAL MARGIN, NEGATIVE FOR ATYPIA OR MALIGNANCY.  Review of Systems Negative except chronic RLQ pain, severe, not sharp or stabbing, diarrhea x10-12 daily and anxiety Had mild, resolved vaginal bleeding after sexual activity one week ago. None since.  She has been eating a regular diet without difficulty.   Bowel movements are abnormal with diarrhea. Pain is not well controlled. Has been out of Percocet for one month, states she called the office for a refill but did not receive a call back. States it was not strong enough when she was taking it  Objective:  BP 140/90  Wt 181 lb (82.101 kg)  Breastfeeding? No Chaperone present for exam which was performed with pt's permission General:Well developed, well nourished.  No acute distress. Abdomen: Bowel sounds normal, soft, non-tender. Pelvic Exam:    External Genitalia:  Normal.    Vagina: Normal    Cervix: well healing, able to see past cervical margin, everted   Assessment:  Post-Op 3 weeks s/p colonization of cervix No social support, poor spousal support Depression, was on Seroquel but is non-complaint due to side effects Anxiety, tx'd with Klonopin and Vyvanse    Doing well postoperatively.   Plan:  1. Wound care discussed   2. Continue any current medications. Renew Percocet for 2 weeks 3. Activity restrictions: none 4. return to work: not applicable. 5. Follow up in 2 weeks. 6. F/u with GI and/or Dr. Francesco Lowe, pain management  7. F/u with Christina Lowe and  Christina Lowe

## 2013-10-23 ENCOUNTER — Encounter: Payer: Medicaid Other | Admitting: Obstetrics and Gynecology

## 2013-11-04 ENCOUNTER — Encounter: Payer: Medicaid Other | Admitting: Obstetrics and Gynecology

## 2014-03-27 ENCOUNTER — Ambulatory Visit: Payer: Medicaid Other | Admitting: Obstetrics and Gynecology

## 2014-04-04 ENCOUNTER — Ambulatory Visit: Payer: Medicaid Other | Admitting: Obstetrics and Gynecology

## 2014-04-08 ENCOUNTER — Ambulatory Visit: Payer: Medicaid Other | Admitting: Obstetrics and Gynecology

## 2014-04-14 ENCOUNTER — Ambulatory Visit: Payer: Medicaid Other | Admitting: Obstetrics and Gynecology

## 2014-04-14 ENCOUNTER — Encounter: Payer: Self-pay | Admitting: Obstetrics and Gynecology

## 2014-04-28 ENCOUNTER — Encounter: Payer: Self-pay | Admitting: Obstetrics and Gynecology

## 2014-06-05 ENCOUNTER — Ambulatory Visit (INDEPENDENT_AMBULATORY_CARE_PROVIDER_SITE_OTHER): Payer: Medicaid Other | Admitting: Obstetrics and Gynecology

## 2014-06-05 ENCOUNTER — Encounter: Payer: Self-pay | Admitting: Obstetrics and Gynecology

## 2014-06-05 VITALS — BP 120/80 | Ht 69.0 in | Wt 145.5 lb

## 2014-06-05 DIAGNOSIS — N949 Unspecified condition associated with female genital organs and menstrual cycle: Secondary | ICD-10-CM

## 2014-06-05 DIAGNOSIS — R102 Pelvic and perineal pain: Principal | ICD-10-CM

## 2014-06-05 DIAGNOSIS — G8929 Other chronic pain: Secondary | ICD-10-CM | POA: Insufficient documentation

## 2014-06-05 MED ORDER — OXYCODONE-ACETAMINOPHEN 5-325 MG PO TABS: 1.0000 | ORAL_TABLET | ORAL | Status: DC | PRN

## 2014-06-05 MED ORDER — MEDROXYPROGESTERONE ACETATE 150 MG/ML IM SUSP: 150.0000 mg | INTRAMUSCULAR | Status: DC

## 2014-06-05 NOTE — Progress Notes (Signed)
Patient ID: Christina Lowe, female   DOB: Jul 03, 1985, 28 y.o.   MRN: [id number] Pt here today for follow up from surgery, pt never came for her post op visit. Pt states that she needs a refill on her [facility] and wants to to talk to Dr. Glo Herring about "fluid" she has on her back.

## 2014-06-05 NOTE — Progress Notes (Signed)
Patient ID: Christina Lowe, female   DOB: Feb 22, 1986, 28 y.o.   MRN: [id number]   [facility] Visit  Patient name: Christina Lowe MRN [id number]  Date of birth: 1986/01/02 followup Conization, and pt with pain med dependency CC & HPI:  Christina Lowe is a 28 y.o. female presenting today to follow-up from a surgery she had in March.  She states that she missed her post-op appointment and would like to discuss the outcome of the surgery.  She is also complaining of fluid in her back that causes some intermittent pain.  She states that she has lost about 20 pounds.    Pt reports that she has been a part of investigation of provider  At another facility, will be asked to testify. Pt informs me that I should expect to be contacted by [facility].  ROS:  All systems have been reviewed and negative unless otherwise indicated in the HPI.   Pertinent History Reviewed:   Reviewed: Significant for  Medical         Past Medical History  Diagnosis Date  . Anxiety     panic disorder  . Depression     has history- ok now  . Asthma     no inhaler  . Vaginal Pap smear, abnormal   . ADHD (attention deficit hyperactivity disorder)   . OCD (obsessive compulsive disorder)                               Surgical Hx:    Past Surgical History  Procedure Laterality Date  . Cesarean section  2006, 2010  . Appendectomy    . Diagnostic laparoscopy    . Cesarean section  06/16/2012    Procedure: CESAREAN SECTION;  Surgeon: Jonnie Kind, MD;  Location: [facility];  Service: Obstetrics;  Laterality: N/A;  . Colonoscopy  10/07/2010    ERX:VQMGQ distal rectal polyp, status post snare polypectomy and ablation, otherwise normal-appearing anal canal, rectum, colon/I doubt significant GI bleeding  . Cervical conization w/bx N/A 09/17/2013    Procedure: CONIZATION CERVIX;  Surgeon: Jonnie Kind, MD;  Location: [facility];  Service: Gynecology;  Laterality: N/A;   Medications: Reviewed & Updated - see associated section                      Current outpatient prescriptions: clonazePAM (KLONOPIN) 1 MG tablet, Take 1 mg by mouth 2 (two) times daily. , Disp: , Rfl: ;  lisdexamfetamine (VYVANSE) 70 MG capsule, Take 70 mg by mouth daily., Disp: , Rfl: ;  medroxyPROGESTERone (DEPO-PROVERA) 150 MG/ML injection, Inject 1 mL (150 mg total) into the muscle every 3 (three) months., Disp: 1 mL, Rfl: 4   Social History: Reviewed -  reports that she has been smoking Cigarettes.  She has a 6.5 pack-year smoking history. She has never used smokeless tobacco.  Objective Findings:  Vitals: Blood pressure 120/80, height 5\' 9"  (1.753 m), weight 145 lb 8 oz (65.998 kg), not currently breastfeeding.  Physical Examination: General appearance - alert, well appearing, and in no distress, oriented to person, place, and time and anxious Abdomen -  Pelvic -  Pt alleges she has chronic rlq pain and is considering having rt ovary removed.  Pt hints of Opiate acquisition from her grandmother when pain is severe.(elusive answers)  Assessment & Plan:   A:  1. 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 2Opiate dependence. P:  1. Colpo asap 2. Limited # of percocet 5 (20) to last til next wk colpo and more thorough eval.  This chart was scribed for Jonnie Kind, MD by Donato Schultz, ED Scribe. This patient was seen in Room 1 and the patient's care was started at 2:40 PM.

## 2014-06-05 NOTE — Patient Instructions (Signed)
Cervical Dysplasia Cervical dysplasia is a condition in which a woman has abnormal changes in the cells of her cervix. The cervix is the opening to the uterus (womb). It is located between the vagina and the uterus. Cervical dysplasia may be the first sign of cervical cancer.  With early detection, treatment, and close follow-up care, nearly all cases of cervical dysplasia can be cured. If left untreated, dysplasia may become more severe.  CAUSES  Cervical dysplasia can be caused by a human papillomavirus (HPV) infection. RISK FACTORS   Having had a sexually transmitted disease, such as chlamydia or a human papillomavirus (HPV) infection.   Becoming sexually active before age 18.   Having had more than one sexual partner.   Not using protection during sexual intercourse, especially with new sexual partners.   Having had cancer of the vagina or vulva.   Having a sexual partner whose previous partner had cancer of the cervix or cervical dysplasia.   Having a sexual partner who has or has had cancer of the penis.   Having a weakened immune system (such as from having HIV or an organ transplant).   Being the daughter of a woman who took diethylstilbestrol(DES) during pregnancy.   Having a family history of cervical cancer.   Smoking. SIGNS AND SYMPTOMS  There are usually no symptoms. If there are symptoms, they may include:   Abnormal vaginal discharge.   Bleeding between periods or after intercourse.   Bleeding during menopause.   Pain during sexual intercourse (dyspareunia). DIAGNOSIS  A test called a Pap test may be done.During this test, cells are taken from the cervix and then looked at under a microscope. A test in which tissue is removed from the cervix (biopsy) may also be done if the Pap test is abnormal or if the cervix looks abnormal.  TREATMENT  Treatment varies based on the severity of the cervical dysplasia. Treatment may include:  Cryotherapy.  During cryotherapy, the abnormal cells are frozen with a steel-tip instrument.   A procedure to remove abnormal tissue from the cervix.  Surgery to remove abnormal tissue. This is usually done in serious cases of cervical dysplasia. Surgical options include:  A cone biopsy. This is a procedure in which the cervical canal and a portion of the center of the cervix are removed.   Hysterectomy. This is a surgery in which the uterus and cervix are removed. HOME CARE INSTRUCTIONS   Only take over-the-counter or prescription medicines for pain or discomfort as directed by your health care provider.   Do not use tampons, have sexual intercourse, or douche until your health care provider says it is okay.  Keep follow-up appointments as directed by your health care provider. Women who have been treated for cervical dysplasia should have regular pelvic exams and Pap tests. During the first year following treatment of cervical dysplasia, Pap tests should be done every 3-4 months. In the second year, they should be done every 6 months or as recommended by your health care provider.  To prevent the condition from developing again, practice safe sex. SEEK MEDICAL CARE IF:  You develop genital warts.  SEEK IMMEDIATE MEDICAL CARE IF:   Your menstrual period is heavier than normal.   You develop bright red bleeding, especially if you have blood clots.   You have a fever.   You have increasing cramps or pain not relieved with medicine.   You are light-headed, unusually weak, or have fainting spells.   You have abnormal   vaginal discharge.   You have abdominal pain. Document Released: 06/13/2005 Document Revised: 06/18/2013 Document Reviewed: 02/06/2013 ExitCare Patient Information 2015 [employer]. This information is not intended to replace advice given to you by your health care provider. Make sure you discuss any questions you have with your health care provider.  

## 2014-06-13 ENCOUNTER — Telehealth: Payer: Self-pay | Admitting: Obstetrics and Gynecology

## 2014-06-16 NOTE — Telephone Encounter (Signed)
Pt has appt in am.

## 2014-06-17 ENCOUNTER — Encounter: Payer: Medicaid Other | Admitting: Obstetrics and Gynecology

## 2014-06-24 ENCOUNTER — Encounter: Payer: Self-pay | Admitting: Obstetrics and Gynecology

## 2014-06-24 ENCOUNTER — Encounter: Payer: Medicaid Other | Admitting: Obstetrics and Gynecology

## 2014-07-03 ENCOUNTER — Telehealth: Payer: Self-pay | Admitting: Obstetrics and Gynecology

## 2014-07-03 NOTE — Telephone Encounter (Signed)
Pt missed last appt . She is to call back for followup appt. For "biopsy"

## 2014-07-14 ENCOUNTER — Encounter: Payer: Medicaid Other | Admitting: Obstetrics and Gynecology

## 2014-07-16 ENCOUNTER — Encounter: Payer: Medicaid Other | Admitting: Obstetrics and Gynecology

## 2014-07-17 ENCOUNTER — Encounter: Payer: Medicaid Other | Admitting: Obstetrics and Gynecology

## 2014-07-21 ENCOUNTER — Encounter: Payer: Medicaid Other | Admitting: Obstetrics and Gynecology

## 2014-07-30 ENCOUNTER — Telehealth: Payer: Self-pay | Admitting: Obstetrics and Gynecology

## 2014-07-30 NOTE — Telephone Encounter (Signed)
Patient informs me she definitely will be keeping her appt tomorrow.

## 2014-07-31 ENCOUNTER — Encounter: Payer: Medicaid Other | Admitting: Obstetrics and Gynecology

## 2014-08-07 ENCOUNTER — Encounter: Payer: Self-pay | Admitting: Obstetrics and Gynecology

## 2014-08-07 ENCOUNTER — Other Ambulatory Visit: Payer: Self-pay | Admitting: Obstetrics and Gynecology

## 2014-08-07 ENCOUNTER — Ambulatory Visit (INDEPENDENT_AMBULATORY_CARE_PROVIDER_SITE_OTHER): Payer: Medicaid Other | Admitting: Obstetrics and Gynecology

## 2014-08-07 VITALS — BP 130/76 | Ht 69.0 in | Wt 151.0 lb

## 2014-08-07 DIAGNOSIS — Z139 Encounter for screening, unspecified: Secondary | ICD-10-CM

## 2014-08-07 DIAGNOSIS — Z3202 Encounter for pregnancy test, result negative: Secondary | ICD-10-CM

## 2014-08-07 DIAGNOSIS — Z3200 Encounter for pregnancy test, result unknown: Secondary | ICD-10-CM

## 2014-08-07 DIAGNOSIS — D069 Carcinoma in situ of cervix, unspecified: Secondary | ICD-10-CM | POA: Insufficient documentation

## 2014-08-07 DIAGNOSIS — Z8541 Personal history of malignant neoplasm of cervix uteri: Secondary | ICD-10-CM

## 2014-08-07 LAB — POCT URINE PREGNANCY: Preg Test, Ur: NEGATIVE

## 2014-08-07 NOTE — Progress Notes (Signed)
Patient ID: Christina Lowe, female   DOB: 1986-06-15, 29 y.o.   MRN: [id number] Pt here today for colposcopy. Pt states that she would also like to have an STD screening.

## 2014-08-07 NOTE — Progress Notes (Addendum)
Patient ID: [name], female   DOB: 12/02/1985, 29 y.o.   MRN: [id number]  This chart was scribed for Jonnie Kind, MD by Donato Schultz, ED Scribe. This patient was seen in Room 2 and the patient's care was started at 4:02 PM.   Christina Lowe 29 y.o. [id number] here for colposcopy for high-grade squamous intraepithelial neoplasia  (HGSIL-encompassing moderate and severe dysplasia) CIN-III NOT involving the margins : clear on 06/26/2013.  Discussed role for HPV in cervical dysplasia, need for surveillance.  She would also like comprehensive STD testing.  Patient given informed consent, signed copy in the chart, time out was performed.  Placed in lithotomy position. Cervix viewed with speculum and colposcope after application of acetic acid.   Colposcopy adequate? Yes  no visible lesions; biopsies obtained at none.   ECC specimen obtained.Yes pt tolerated ecc poorly All specimens were labelled and sent to pathology.  Colposcopy IMPRESSION: normal exam s/p CKC for CIN III   Pt needs pain clinic referral  Patient was given post procedure instructions. Will follow up pathology and manage accordingly.  Routine preventative health maintenance measures emphasized.    I personally performed the services described in this documentation, which was scribed in my presence. The recorded information has been reviewed and considered accurate. It has been edited as necessary during review. Jonnie Kind, MD

## 2014-08-07 NOTE — Patient Instructions (Signed)
ecc results to be sent to you by [facility]

## 2014-08-07 NOTE — Addendum Note (Signed)
Addended by: Farley Ly on: 08/07/2014 04:33 PM   Modules accepted: Orders

## 2014-08-11 LAB — GC/CHLAMYDIA PROBE AMP
Chlamydia trachomatis, NAA: NEGATIVE
Neisseria gonorrhoeae by PCR: NEGATIVE

## 2014-08-19 ENCOUNTER — Telehealth: Payer: Self-pay | Admitting: *Deleted

## 2014-08-19 NOTE — Telephone Encounter (Signed)
Pt aware of results and aware that Dr. Glo Herring wants to have a follow up appointment with her to discuss her next treatments Pt verbalized understanding.

## 2014-08-22 ENCOUNTER — Ambulatory Visit: Payer: Medicaid Other | Admitting: Obstetrics and Gynecology

## 2014-08-22 ENCOUNTER — Encounter: Payer: Self-pay | Admitting: Obstetrics and Gynecology

## 2014-11-03 ENCOUNTER — Ambulatory Visit: Payer: Medicaid Other | Admitting: Obstetrics and Gynecology

## 2014-11-06 ENCOUNTER — Ambulatory Visit: Payer: Medicaid Other | Admitting: Obstetrics and Gynecology

## 2014-11-14 ENCOUNTER — Ambulatory Visit: Payer: Medicaid Other | Admitting: Obstetrics and Gynecology

## 2014-12-01 ENCOUNTER — Encounter: Payer: Self-pay | Admitting: *Deleted

## 2014-12-01 ENCOUNTER — Ambulatory Visit: Payer: Medicaid Other | Admitting: Obstetrics and Gynecology

## 2015-03-24 ENCOUNTER — Ambulatory Visit: Payer: Medicaid Other | Admitting: Obstetrics and Gynecology

## 2015-04-02 ENCOUNTER — Ambulatory Visit: Payer: Medicaid Other | Admitting: Obstetrics and Gynecology

## 2015-04-07 ENCOUNTER — Ambulatory Visit (INDEPENDENT_AMBULATORY_CARE_PROVIDER_SITE_OTHER): Payer: Medicaid Other | Admitting: Obstetrics and Gynecology

## 2015-04-07 ENCOUNTER — Encounter: Payer: Self-pay | Admitting: Obstetrics and Gynecology

## 2015-04-07 VITALS — BP 110/60 | Ht 69.0 in | Wt 145.0 lb

## 2015-04-07 DIAGNOSIS — Z8541 Personal history of malignant neoplasm of cervix uteri: Secondary | ICD-10-CM

## 2015-04-07 DIAGNOSIS — Z3202 Encounter for pregnancy test, result negative: Secondary | ICD-10-CM | POA: Diagnosis not present

## 2015-04-07 DIAGNOSIS — Z3200 Encounter for pregnancy test, result unknown: Secondary | ICD-10-CM

## 2015-04-07 MED ORDER — MEDROXYPROGESTERONE ACETATE 150 MG/ML IM SUSP: 150.0000 mg | INTRAMUSCULAR | Status: DC

## 2015-04-07 NOTE — Progress Notes (Signed)
Patient ID: Christina Lowe, female   DOB: 05-Apr-1986, 29 y.o.   MRN: [id number]   [facility] Visit  Patient name: Christina Lowe MRN [id number]  Date of birth: 11/23/1985  CC & HPI:  Christina Lowe is a 29 y.o. female presenting today for follow-up of conization. Patient has been noncompliant with follow-up for at least 6 months. Recent conization showed CIN 3 with involvement of the endocervical margin for which reassessment and future planning was needed the patient is missed multiple appointments  ROS:  Patient has a anxiety and low pain tolerance, bipolar history and would require hospital sedation for any biopsy of the cervix. LEEP is not an option per patient anxiety  Pertinent History Reviewed:   Reviewed: Significant for early childhood sexual activity Clair Gulling patient's youngest child is 33 years of age. Patient acknowledges use marijuana for calming effects patient has not made any permanent sterilization decisions. She is aware of the risk of residual dysplasia and the importance of full treatment to avoid any risk of proceeding toward cancer. Medical         Past Medical History  Diagnosis Date  . Anxiety     panic disorder  . Depression     has history- ok now  . Asthma     no inhaler  . Vaginal Pap smear, abnormal   . ADHD (attention deficit hyperactivity disorder)   . OCD (obsessive compulsive disorder)                               Surgical Hx:    Past Surgical History  Procedure Laterality Date  . Cesarean section  2006, 2010  . Appendectomy    . Diagnostic laparoscopy    . Cesarean section  06/16/2012    Procedure: CESAREAN SECTION;  Surgeon: Jonnie Kind, MD;  Location: [facility];  Service: Obstetrics;  Laterality: N/A;  . Colonoscopy  10/07/2010    KZL:[id number] distal rectal polyp, status post snare polypectomy and ablation, otherwise normal-appearing anal canal, rectum, colon/I doubt significant GI bleeding  . Cervical conization w/bx N/A 09/17/2013    Procedure:  CONIZATION CERVIX;  Surgeon: Jonnie Kind, MD;  Location: [facility];  Service: Gynecology;  Laterality: N/A;   Medications: Reviewed & Updated - see associated section                       Current outpatient prescriptions:  .  clonazePAM (KLONOPIN) 1 MG tablet, Take 1 mg by mouth 2 (two) times daily. , Disp: , Rfl:  .  lisdexamfetamine (VYVANSE) 70 MG capsule, Take 70 mg by mouth daily., Disp: , Rfl:  .  medroxyPROGESTERone (DEPO-PROVERA) 150 MG/ML injection, Inject 1 mL (150 mg total) into the muscle every 3 (three) months., Disp: 1 mL, Rfl: 4 .  medroxyPROGESTERone (DEPO-PROVERA) 150 MG/ML injection, Inject 1 mL (150 mg total) into the muscle every 3 (three) months., Disp: 1 mL, Rfl: 4   Social History: Reviewed -  reports that she has been smoking Cigarettes.  She has a 13 pack-year smoking history. She has never used smokeless tobacco.  Objective Findings:  Vitals: Blood pressure 110/60, height 5\' 9"  (1.753 m), weight 145 lb (65.772 kg).  Physical Examination: General appearance - alert, well appearing, and in no distress and oriented to person, place, and time Mental status - alert, oriented to person, place, and time, hyperactive, consistent with history of prior  bipolar disorder   Assessment & Plan:   A:  1. History of CIN-3 with endocervical's surgical margins on conization  P:  1. Patient needs colposcopy. The patient would not be a candidate for LEEP due to anxiety, and attitude. Will decide and discuss surgical plan with patient after colposcopy    Time: Over 25 minutes spent with patient, including lengthy discussion of risk factors, current emotional status, and receiving up-to-date on recent delivery of her last baby 3 years ago

## 2015-04-08 LAB — POCT URINE PREGNANCY: PREG TEST UR: NEGATIVE

## 2015-04-15 ENCOUNTER — Other Ambulatory Visit: Payer: Medicaid Other | Admitting: Obstetrics and Gynecology

## 2015-04-20 ENCOUNTER — Other Ambulatory Visit: Payer: Medicaid Other | Admitting: Obstetrics and Gynecology

## 2015-04-23 ENCOUNTER — Other Ambulatory Visit: Payer: Medicaid Other | Admitting: Obstetrics and Gynecology

## 2015-05-04 ENCOUNTER — Other Ambulatory Visit: Payer: Medicaid Other | Admitting: Obstetrics and Gynecology

## 2015-05-11 ENCOUNTER — Other Ambulatory Visit: Payer: Medicaid Other | Admitting: Obstetrics and Gynecology

## 2015-05-11 ENCOUNTER — Telehealth: Payer: Self-pay | Admitting: Obstetrics and Gynecology

## 2015-05-11 NOTE — Telephone Encounter (Signed)
PATIENT has [facility] for several appts. I called pt to encourage compliance with today's appt. Importance of followup emphasized. Pt states she WILL be seen today , though she will call this am to ask appt to be moved to 1:30 pm.

## 2015-05-13 ENCOUNTER — Other Ambulatory Visit (HOSPITAL_COMMUNITY)
Admission: RE | Admit: 2015-05-13 | Discharge: 2015-05-13 | Disposition: A | Discharge status: Home / Self Care | Payer: Medicaid Other | Source: Ambulatory Visit | Attending: Obstetrics and Gynecology | Admitting: Obstetrics and Gynecology

## 2015-05-13 ENCOUNTER — Ambulatory Visit (INDEPENDENT_AMBULATORY_CARE_PROVIDER_SITE_OTHER): Payer: Medicaid Other | Admitting: Obstetrics and Gynecology

## 2015-05-13 ENCOUNTER — Encounter: Payer: Self-pay | Admitting: Obstetrics and Gynecology

## 2015-05-13 VITALS — BP 120/66 | Ht 69.0 in

## 2015-05-13 DIAGNOSIS — Z01411 Encounter for gynecological examination (general) (routine) with abnormal findings: Secondary | ICD-10-CM | POA: Diagnosis present

## 2015-05-13 DIAGNOSIS — Z0000 Encounter for general adult medical examination without abnormal findings: Secondary | ICD-10-CM | POA: Diagnosis not present

## 2015-05-13 DIAGNOSIS — Z1151 Encounter for screening for human papillomavirus (HPV): Secondary | ICD-10-CM | POA: Diagnosis not present

## 2015-05-13 DIAGNOSIS — Z01419 Encounter for gynecological examination (general) (routine) without abnormal findings: Secondary | ICD-10-CM

## 2015-05-13 DIAGNOSIS — Z113 Encounter for screening for infections with a predominantly sexual mode of transmission: Secondary | ICD-10-CM | POA: Diagnosis present

## 2015-05-13 NOTE — Progress Notes (Signed)
Patient ID: Christina Lowe, female   DOB: [birth year], 29 y.o.   MRN: [id number] Pt here today for annual exam. Pt denies any problems or concerns at this time.

## 2015-05-13 NOTE — Progress Notes (Signed)
Assessment:  1. Annual Gyn Exam 2. 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 pap smear done, next pap due 1 year 2. return annually or prn 3. Advised hysterectomy upon completion of child bearing Subjective:  Christina Lowe is a 29 y.o. female [phone] who presents for annual exam. No LMP recorded. Patient has had an injection. The patient has no complaints today. Pt has a h/o CIN III with severe dysplasia and involvement of endocervical margin. Pt does not want to have hysterectomy at this time due to wanting to have more children. She is currently on Depo-provera for birth control  Discussed with pt that due to her h/o CIN-III to have babies in timely manner and have hysterectomy by age 75  The following portions of the patient's history were reviewed and updated as appropriate: allergies, current medications, past family history, past medical history, past social history, past surgical history and problem list. Past Medical History  Diagnosis Date  . Anxiety     panic disorder  . Depression     has history- ok now  . Asthma     no inhaler  . Vaginal Pap smear, abnormal   . ADHD (attention deficit hyperactivity disorder)   . OCD (obsessive compulsive disorder)     Past Surgical History  Procedure Laterality Date  . Cesarean section  2006, 2010  . Appendectomy    . Diagnostic laparoscopy    . Cesarean section  06/16/2012    Procedure: CESAREAN SECTION;  Surgeon: Jonnie Kind, MD;  Location: [facility];  Service: Obstetrics;  Laterality: N/A;  . Colonoscopy  10/07/2010    [id number] distal rectal polyp, status post snare polypectomy and ablation, otherwise normal-appearing anal canal, rectum, colon/I doubt significant GI bleeding  . Cervical conization w/bx N/A 09/17/2013    Procedure: CONIZATION CERVIX;  Surgeon: Jonnie Kind, MD;  Location: [facility];  Service: Gynecology;  Laterality: N/A;      Current outpatient prescriptions:  .  clonazePAM (KLONOPIN) 1 MG tablet, Take 1 mg by mouth 2 (two) times daily. , Disp: , Rfl:  .  lisdexamfetamine (VYVANSE) 70 MG capsule, Take 70 mg by mouth daily., Disp: , Rfl:  .  medroxyPROGESTERone (DEPO-PROVERA) 150 MG/ML injection, Inject 1 mL (150 mg total) into the muscle every 3 (three) months., Disp: 1 mL, Rfl: 4  Review of Systems Constitutional: negative Gastrointestinal: negative Genitourinary: negative  Objective:  BP 120/66 mmHg  Ht 5\' 9"  (1.753 m)   BMI: There is no weight on file to calculate BMI.  General Appearance: Alert, appropriate appearance for age. No acute distress HEENT: Grossly normal Breast Exam: No masses or nodes.No dimpling, nipple retraction or discharge. Gastrointestinal: Soft, non-tender, no masses or organomegaly Pelvic Exam:  VAGINA: entrance is normal with good support CERVIX: tiny and well supported, flushed with vaginal wall s/p conization. Deviated slightly to the left Neurologic: Normal gait and speech, no tremor  Psychiatric: Alert and oriented, appropriate affect.  Urinalysis:normal   Mallory Shirk. MD Pgr [phone] 1:40 PM   By signing my name below, I, Erling Conte, attest that this documentation has been prepared under the direction and in the presence of Jonnie Kind, MD. Electronically Signed: Erling Conte, ED Scribe. 05/13/2015. 1:44 PM.  I personally performed the services described in this documentation, which was SCRIBED in my presence. The recorded information has been reviewed and considered accurate. It has been edited as  necessary during review. Jonnie Kind, MD

## 2015-05-18 DIAGNOSIS — Z01419 Encounter for gynecological examination (general) (routine) without abnormal findings: Secondary | ICD-10-CM | POA: Insufficient documentation

## 2015-05-18 LAB — CYTOLOGY - PAP

## 2015-05-27 ENCOUNTER — Ambulatory Visit: Payer: Medicaid Other | Admitting: Obstetrics and Gynecology

## 2015-05-28 ENCOUNTER — Encounter: Payer: Self-pay | Admitting: Obstetrics and Gynecology

## 2015-05-28 ENCOUNTER — Ambulatory Visit: Payer: Medicaid Other | Admitting: Obstetrics and Gynecology

## 2015-07-28 ENCOUNTER — Telehealth: Payer: Self-pay | Admitting: *Deleted

## 2015-07-28 NOTE — Telephone Encounter (Signed)
Spoke with the pt and she stated that she has some concerns and that she wants to discuss this with Dr. Glo Herring. Pt was given an appointment to discuss with Dr. Glo Herring.

## 2015-08-07 ENCOUNTER — Encounter: Payer: Medicaid Other | Admitting: Obstetrics and Gynecology

## 2015-08-12 ENCOUNTER — Encounter: Payer: Medicaid Other | Admitting: Obstetrics and Gynecology

## 2015-08-13 ENCOUNTER — Encounter: Payer: Medicaid Other | Admitting: Obstetrics and Gynecology

## 2015-08-13 ENCOUNTER — Encounter: Payer: Self-pay | Admitting: Obstetrics and Gynecology

## 2015-08-13 ENCOUNTER — Telehealth: Payer: Self-pay | Admitting: Obstetrics and Gynecology

## 2015-08-13 NOTE — Telephone Encounter (Signed)
Pt has [place] again. Unable to reach by phone.

## 2015-08-20 ENCOUNTER — Ambulatory Visit (INDEPENDENT_AMBULATORY_CARE_PROVIDER_SITE_OTHER): Payer: Medicaid Other | Admitting: Obstetrics and Gynecology

## 2015-08-20 ENCOUNTER — Encounter: Payer: Self-pay | Admitting: Obstetrics and Gynecology

## 2015-08-20 ENCOUNTER — Telehealth: Payer: Self-pay | Admitting: Obstetrics and Gynecology

## 2015-08-20 DIAGNOSIS — Z91199 Patient's noncompliance with other medical treatment and regimen due to unspecified reason: Secondary | ICD-10-CM | POA: Insufficient documentation

## 2015-08-20 DIAGNOSIS — Z5329 Procedure and treatment not carried out because of patient's decision for other reasons: Secondary | ICD-10-CM

## 2015-08-20 NOTE — Telephone Encounter (Signed)
Recurrent failure to keep appt.

## 2015-08-31 ENCOUNTER — Telehealth: Payer: Self-pay | Admitting: Obstetrics and Gynecology

## 2015-08-31 ENCOUNTER — Encounter: Payer: Self-pay | Admitting: Obstetrics and Gynecology

## 2015-08-31 ENCOUNTER — Encounter: Payer: Medicaid Other | Admitting: Obstetrics and Gynecology

## 2015-08-31 ENCOUNTER — Encounter: Payer: Self-pay | Admitting: *Deleted
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
## 2015-09-09 ENCOUNTER — Telehealth: Payer: Self-pay | Admitting: Obstetrics and Gynecology

## 2015-09-09 NOTE — Telephone Encounter (Signed)
No answer at phone number of record

## 2015-09-11 ENCOUNTER — Telehealth: Payer: Self-pay | Admitting: Obstetrics and Gynecology

## 2015-09-11 ENCOUNTER — Telehealth: Payer: Self-pay | Admitting: *Deleted

## 2015-09-11 NOTE — Telephone Encounter (Signed)
Left message with mother that pt is not keeping appts.

## 2015-09-11 NOTE — Telephone Encounter (Signed)
Pt called stating that Dr. Glo Herring tried to call her but her phone was disconnected, he spoke with the pt's mother and told her mother that the pt had missed her last 67 appointments and that she really needed to keep her appointment to get her problem taken care of and treated. I advised the pt of her last result and advised her that she really needs to keep her appointment and be treated before things get any worse. Pt states that she has noticed that she has had weight loss and states that she doesn't know why. Pt states that her and her boyfriend have one car and she is her grandmother's POA and she is trying to take care of her kids and things don't always work out. I advised the pt that she needs to make an appointment and keep it so that we can treat her and take care of the abnormal paps she has had. Pt verbalized understanding and phone call was switched to front office and an appointment was made at the pt's convenience.

## 2015-09-21 ENCOUNTER — Other Ambulatory Visit: Payer: Self-pay | Admitting: Obstetrics and Gynecology

## 2015-09-21 ENCOUNTER — Encounter: Payer: Self-pay | Admitting: Obstetrics and Gynecology

## 2015-09-21 ENCOUNTER — Ambulatory Visit (INDEPENDENT_AMBULATORY_CARE_PROVIDER_SITE_OTHER): Payer: Medicaid Other | Admitting: Obstetrics and Gynecology

## 2015-09-21 VITALS — BP 120/60 | Ht 69.0 in | Wt 138.5 lb

## 2015-09-21 DIAGNOSIS — R87610 Atypical squamous cells of undetermined significance on cytologic smear of cervix (ASC-US): Secondary | ICD-10-CM

## 2015-09-21 DIAGNOSIS — Z3202 Encounter for pregnancy test, result negative: Secondary | ICD-10-CM | POA: Diagnosis not present

## 2015-09-21 DIAGNOSIS — Z3200 Encounter for pregnancy test, result unknown: Secondary | ICD-10-CM

## 2015-09-21 LAB — POCT URINE PREGNANCY: PREG TEST UR: NEGATIVE

## 2015-09-21 NOTE — Progress Notes (Signed)
Patient ID: Christina Lowe, female   DOB: [birth year], 30 y.o.   MRN: [id number] Pt here today for colposcopy and discuss her next steps.

## 2015-09-21 NOTE — Addendum Note (Signed)
Addended by: Farley Ly on: 09/21/2015 02:57 PM   Modules accepted: Orders

## 2015-09-21 NOTE — Progress Notes (Signed)
Patient ID: Christina Lowe, female   DOB: 07-May-1986, 30 y.o.   MRN: [id number]  Christina Lowe 30 y.o. [id number] here for colposcopy for H/o CIN-III with severe dysplasia and involved endocervical surgical margins on conization, s/p normal colpo 08/2014 pap smear on 05/13/15. Showed ASCUS WITH +HPV. This is the best pap in years   Discussed role for HPV in cervical dysplasia, need for surveillance.  Patient given informed consent, signed copy in the chart, time out was performed.  Placed in lithotomy position. Cervix viewed with speculum and colposcope after application of acetic acid.   Colposcopy adequate? Yes Findings: focal area of  acetowhite lesion 5 mm nodule at 12 o'clock; biopsies obtained at 12 o'clock. Removing the area of concern ECC specimen not required.  All specimens were labelled and sent to pathology. Colposcopy IMPRESSION: CIN I  Patient was given post procedure instructions. Will follow up pathology and manage accordingly.  Routine preventative health maintenance measures emphasized.  By signing my name below, I, Terressa Koyanagi, attest that this documentation has been prepared under the direction and in the presence of Mallory Shirk, MD. Electronically Signed: Terressa Koyanagi, ED Scribe. 09/21/2015. 12:45 PM.   I personally performed the services described in this documentation, which was SCRIBED in my presence. The recorded information has been reviewed and considered accurate. It has been edited as necessary during review. Jonnie Kind, MD  Jonnie Kind 09/21/2015

## 2015-09-28 ENCOUNTER — Telehealth: Payer: Self-pay | Admitting: Obstetrics and Gynecology

## 2015-09-28 NOTE — Telephone Encounter (Signed)
Pt aware of results and recommendations

## 2015-12-23 ENCOUNTER — Telehealth: Payer: Self-pay | Admitting: *Deleted

## 2015-12-23 MED ORDER — MEDROXYPROGESTERONE ACETATE 150 MG/ML IM SUSP: 150.0000 mg | INTRAMUSCULAR | Status: AC

## 2015-12-23 NOTE — Telephone Encounter (Signed)
Pt aware of DEPO rx being sent to her pharmacy.

## 2016-05-13 ENCOUNTER — Other Ambulatory Visit: Payer: Medicaid Other | Admitting: Obstetrics and Gynecology

## 2016-05-16 ENCOUNTER — Ambulatory Visit: Payer: Medicaid Other | Admitting: Obstetrics and Gynecology
